# Patient Record
Sex: Female | Born: 1950
Health system: Southern US, Community
[De-identification: ages and names within clinical notes are randomized; demographics above are authoritative.]

## PROBLEM LIST (undated history)

## (undated) DIAGNOSIS — C801 Malignant (primary) neoplasm, unspecified: Secondary | ICD-10-CM

## (undated) DIAGNOSIS — C4490 Unspecified malignant neoplasm of skin, unspecified: Secondary | ICD-10-CM

## (undated) DIAGNOSIS — R519 Headache, unspecified: Secondary | ICD-10-CM

## (undated) DIAGNOSIS — E7800 Pure hypercholesterolemia, unspecified: Secondary | ICD-10-CM

## (undated) DIAGNOSIS — H539 Unspecified visual disturbance: Secondary | ICD-10-CM

## (undated) DIAGNOSIS — R51 Headache: Secondary | ICD-10-CM

## (undated) HISTORY — PX: HYSTERECTOMY ABDOMINAL WITH SALPINGECTOMY: SHX6725

## (undated) HISTORY — DX: Malignant (primary) neoplasm, unspecified: C80.1

## (undated) HISTORY — DX: Headache: R51

## (undated) HISTORY — DX: Unspecified malignant neoplasm of skin, unspecified: C44.90

## (undated) HISTORY — DX: Headache, unspecified: R51.9

## (undated) HISTORY — PX: TONSILLECTOMY: SUR1361

## (undated) HISTORY — DX: Pure hypercholesterolemia, unspecified: E78.00

## (undated) HISTORY — DX: Unspecified visual disturbance: H53.9

---

## 1999-03-26 ENCOUNTER — Other Ambulatory Visit: Admission: RE | Admit: 1999-03-26 | Discharge: 1999-03-26 | Payer: Self-pay | Admitting: *Deleted

## 1999-05-08 ENCOUNTER — Other Ambulatory Visit: Admission: RE | Admit: 1999-05-08 | Discharge: 1999-05-08 | Payer: Self-pay | Admitting: *Deleted

## 2000-04-05 ENCOUNTER — Other Ambulatory Visit: Admission: RE | Admit: 2000-04-05 | Discharge: 2000-04-05 | Payer: Self-pay | Admitting: *Deleted

## 2001-03-23 ENCOUNTER — Other Ambulatory Visit: Admission: RE | Admit: 2001-03-23 | Discharge: 2001-03-23 | Payer: Self-pay | Admitting: *Deleted

## 2001-04-11 ENCOUNTER — Inpatient Hospital Stay (HOSPITAL_COMMUNITY): Admission: RE | Admit: 2001-04-11 | Discharge: 2001-04-12 | Payer: Self-pay | Admitting: *Deleted

## 2001-06-04 ENCOUNTER — Emergency Department (HOSPITAL_COMMUNITY): Admission: EM | Admit: 2001-06-04 | Discharge: 2001-06-04 | Payer: Self-pay | Admitting: Emergency Medicine

## 2001-06-04 ENCOUNTER — Emergency Department (HOSPITAL_COMMUNITY): Admission: EM | Admit: 2001-06-04 | Discharge: 2001-06-05 | Payer: Self-pay | Admitting: Emergency Medicine

## 2002-06-21 ENCOUNTER — Other Ambulatory Visit: Admission: RE | Admit: 2002-06-21 | Discharge: 2002-06-21 | Payer: Self-pay | Admitting: *Deleted

## 2003-07-22 ENCOUNTER — Other Ambulatory Visit: Admission: RE | Admit: 2003-07-22 | Discharge: 2003-07-22 | Payer: Self-pay | Admitting: *Deleted

## 2003-10-15 ENCOUNTER — Inpatient Hospital Stay (HOSPITAL_COMMUNITY): Admission: AD | Admit: 2003-10-15 | Discharge: 2003-10-16 | Payer: Self-pay | Admitting: *Deleted

## 2003-10-16 ENCOUNTER — Encounter: Payer: Self-pay | Admitting: *Deleted

## 2014-03-29 ENCOUNTER — Other Ambulatory Visit: Payer: Self-pay | Admitting: Orthopedic Surgery

## 2014-03-29 DIAGNOSIS — M545 Low back pain, unspecified: Secondary | ICD-10-CM

## 2014-04-01 ENCOUNTER — Ambulatory Visit
Admission: RE | Admit: 2014-04-01 | Discharge: 2014-04-01 | Disposition: A | Discharge status: Home / Self Care | Payer: BC Managed Care – PPO | Source: Ambulatory Visit | Attending: Orthopedic Surgery | Admitting: Orthopedic Surgery

## 2014-04-01 DIAGNOSIS — M545 Low back pain, unspecified: Secondary | ICD-10-CM

## 2015-01-13 ENCOUNTER — Emergency Department: Payer: Self-pay | Admitting: Emergency Medicine

## 2015-01-13 LAB — URINALYSIS, COMPLETE
Bilirubin,UR: NEGATIVE
Glucose,UR: NEGATIVE mg/dL
Ketone: NEGATIVE
Nitrite: NEGATIVE
Ph: 5
Protein: 30
RBC,UR: 469 /HPF
Specific Gravity: 1.011
Squamous Epithelial: 5
WBC UR: 626 /HPF

## 2015-01-13 LAB — COMPREHENSIVE METABOLIC PANEL
Albumin: 3.7 g/dL (ref 3.4–5.0)
Alkaline Phosphatase: 67 U/L
Anion Gap: 8 (ref 7–16)
BUN: 10 mg/dL (ref 7–18)
Bilirubin,Total: 0.3 mg/dL (ref 0.2–1.0)
Calcium, Total: 8.3 mg/dL — ABNORMAL LOW (ref 8.5–10.1)
Chloride: 107 mmol/L (ref 98–107)
Co2: 24 mmol/L (ref 21–32)
Creatinine: 1.19 mg/dL (ref 0.60–1.30)
EGFR (African American): 59 — ABNORMAL LOW
EGFR (Non-African Amer.): 49 — ABNORMAL LOW
Glucose: 96 mg/dL (ref 65–99)
Osmolality: 276 (ref 275–301)
Potassium: 3.9 mmol/L (ref 3.5–5.1)
SGOT(AST): 18 U/L (ref 15–37)
SGPT (ALT): 47 U/L
Sodium: 139 mmol/L (ref 136–145)
Total Protein: 6.9 g/dL (ref 6.4–8.2)

## 2015-01-13 LAB — CBC
HCT: 47.1 % — ABNORMAL HIGH
HGB: 15.7 g/dL
MCH: 30.4 pg
MCHC: 33.2 g/dL
MCV: 92 fL
Platelet: 301 x10 3/mm 3
RBC: 5.15 X10 6/mm 3
RDW: 13 %
WBC: 9 x10 3/mm 3

## 2015-01-15 LAB — URINE CULTURE

## 2015-01-21 ENCOUNTER — Other Ambulatory Visit: Payer: Self-pay | Admitting: Family Medicine

## 2015-01-21 DIAGNOSIS — Z8271 Family history of polycystic kidney: Secondary | ICD-10-CM

## 2015-01-21 DIAGNOSIS — N390 Urinary tract infection, site not specified: Secondary | ICD-10-CM

## 2015-01-27 ENCOUNTER — Ambulatory Visit
Admission: RE | Admit: 2015-01-27 | Discharge: 2015-01-27 | Disposition: A | Discharge status: Home / Self Care | Payer: BC Managed Care – PPO | Source: Ambulatory Visit | Attending: Family Medicine | Admitting: Family Medicine

## 2015-01-27 DIAGNOSIS — N390 Urinary tract infection, site not specified: Secondary | ICD-10-CM

## 2015-01-27 DIAGNOSIS — Z8271 Family history of polycystic kidney: Secondary | ICD-10-CM

## 2016-06-30 ENCOUNTER — Other Ambulatory Visit: Payer: Self-pay | Admitting: *Deleted

## 2016-07-05 ENCOUNTER — Ambulatory Visit (INDEPENDENT_AMBULATORY_CARE_PROVIDER_SITE_OTHER): Payer: BC Managed Care – PPO | Admitting: Neurology

## 2016-07-05 ENCOUNTER — Encounter: Payer: Self-pay | Admitting: Neurology

## 2016-07-05 VITALS — BP 108/66 | HR 68 | Resp 14 | Ht 61.0 in | Wt 139.0 lb

## 2016-07-05 DIAGNOSIS — F419 Anxiety disorder, unspecified: Secondary | ICD-10-CM | POA: Diagnosis not present

## 2016-07-05 DIAGNOSIS — M542 Cervicalgia: Secondary | ICD-10-CM

## 2016-07-05 DIAGNOSIS — G43909 Migraine, unspecified, not intractable, without status migrainosus: Secondary | ICD-10-CM | POA: Diagnosis not present

## 2016-07-05 DIAGNOSIS — G4700 Insomnia, unspecified: Secondary | ICD-10-CM

## 2016-07-05 MED ORDER — TOPIRAMATE ER 100 MG PO CAP24: 100.0000 mg | ORAL_CAPSULE | Freq: Every day | ORAL | Status: DC

## 2016-07-05 MED ORDER — RELPAX 40 MG PO TABS: 40.0000 mg | ORAL_TABLET | Freq: Once | ORAL | Status: DC

## 2016-07-05 MED ORDER — ZOLPIDEM TARTRATE 10 MG PO TABS: 10.0000 mg | ORAL_TABLET | Freq: Every evening | ORAL | Status: DC | PRN

## 2016-07-05 NOTE — Progress Notes (Signed)
[facility]  PATIENT: Lisa Banks DOB: 1951/06/19  REFERRING DOCTOR OR PCP:  Mosetta Anis, MD [facility]) SOURCE: patient, records from Dr. Baltazar Najjar  _________________________________   HISTORICAL  CHIEF COMPLAINT:  Chief Complaint  Patient presents with  . Headaches    Former pt. of Dr. Conrad Tekonsha from [facility]. Here for eval of migraines.  Last had Botox around 2014.  She had been seeing Dr. Nile Dear after Dr. Ermalene Postin left.  Dr. Baltazar Najjar had started her on Trokendi and she continued Relpax as a rescue med. Sts. h/a's were stable until May.  Before h/a's got worse, she had a tickbite, which she saw her pcp for.  She wonders if she needs to be checked for RMSF or Lyme dz./fim  . Insomnia    She c/o difficulty going to and staying asleep. She takes Ambien 10mg , 1/2 tab nightly.  If rx. for Ambien 5mg  is given, insurance will only cover #15 tabs monthly.  So, she needs rx.for 10mg .  Last sleep study was yrs. ago at the [facility]/fim    HISTORY OF PRESENT ILLNESS:  I had the pleasure of seeing your patient, Lisa Banks, at [facility] for neurologic consultation regarding her daily headaches and insomnia.  Headache history: She has had migraine headaches since she was in her early teen years about 50 years ago. Initially, her migraines were intermittent and mostly associated with her period.   Then, in her 73s the headaches would also be triggered by stress and began to become more frequent. Sometimes she would have a week or 2 of headache in a row and then be better for a while. By her late 43s, the headaches were near daily. Typically, she would have a dull pain in the back of her head that would intensify on a near daily basis to include the entire head with pounding pain.  She would have nausea and sometimes vomiting. Additionally she had photophobia and phonophobia. Moving would make the headache worse.  Resting in a quiet dark room would  help. She was placed on Relpax at some point and that would help the headaches but the headaches often came back the next day. Several years ago, when she was seen Dr. Ermalene Postin, she took Botox for a couple years. That helped her headaches. She stopped Botox around 2014. She then started to see Dr. Baltazar Najjar and she placed her on Trokendi with Relpax as a rescue medication. That worked rather well for her until recently.    In early May, she had a tic bite. A couple days later she began to have a headache and the headache has been near daily since then. The headache is a little bit different than her typical migraine headaches in that it is more dull in the head and over the eyes without nausea and with less photophobia and phonophobia. Relpax helps but the headache will come back the next day.  Neck pain: She has had neck pain, right much more than left, since early May. Headaches often intensify from the neck pain.  Insomnia: She has a long history of insomnia with difficulty with sleep onset and sleep maintenance. Many years ago she had a sleep study at the [facility] headache clinic and was told that not reach deep sleep. She did not have sleep apnea. She has been taking Ambien 5 mg (one half of a 10 mg pill) nightly with benefit.  Mood: She takes Cymbalta for anxiety with benefit. She is on a  fairly low dose of 20 mg and tolerates it well.  REVIEW OF SYSTEMS: Constitutional: No fevers, chills, sweats, or change in appetite Eyes: No visual changes, double vision, eye pain Ear, nose and throat: No hearing loss, ear pain, nasal congestion, sore throat Cardiovascular: No chest pain, palpitations Respiratory: No shortness of breath at rest or with exertion.   No wheezes GastrointestinaI: No nausea, vomiting, diarrhea, abdominal pain, fecal incontinence Genitourinary: No dysuria, urinary retention or frequency.  No nocturia. Musculoskeletal: No neck pain, back pain Integumentary: No rash, pruritus, skin  lesions Neurological: as above Psychiatric: No depression at this time.  No anxiety Endocrine: No palpitations, diaphoresis, change in appetite, change in weigh or increased thirst Hematologic/Lymphatic: No anemia, purpura, petechiae. Allergic/Immunologic: No itchy/runny eyes, nasal congestion, recent allergic reactions, rashes  ALLERGIES: No Known Allergies  HOME MEDICATIONS:  Current outpatient prescriptions:  .  Topiramate ER (TROKENDI XR) 100 MG CP24, Take 100 mg by mouth daily., Disp: 30 capsule, Rfl: 11 .  DULoxetine (CYMBALTA) 20 MG capsule, Take by mouth., Disp: , Rfl:  .  RELPAX 40 MG tablet, Take 1 tablet (40 mg total) by mouth once. May repeat in 2 hours if headache persists or recurs., Disp: 12 tablet, Rfl: 11 .  zolpidem (AMBIEN) 10 MG tablet, Take 1 tablet (10 mg total) by mouth at bedtime as needed for sleep., Disp: 15 tablet, Rfl: 5  PAST MEDICAL HISTORY: Past Medical History  Diagnosis Date  . Headache   . Cancer ([place])   . Skin cancer   . High cholesterol   . Vision abnormalities     PAST SURGICAL HISTORY: Past Surgical History  Procedure Laterality Date  . Tonsillectomy    . Hysterectomy abdominal with salpingectomy    . Bladder repair w/ cesarean section      FAMILY HISTORY: Family History  Problem Relation Age of Onset  . Hypertension Mother   . Dementia Mother   . Stroke Father     SOCIAL HISTORY:  Social History   Social History  . Marital Status: Married    Spouse Name: N/A  . Number of Children: N/A  . Years of Education: N/A   Occupational History  . Not on file.   Social History Main Topics  . Smoking status: Never Smoker   . Smokeless tobacco: Not on file  . Alcohol Use: No  . Drug Use: No  . Sexual Activity: Not on file   Other Topics Concern  . Not on file   Social History Narrative  . No narrative on file     PHYSICAL EXAM  Filed Vitals:   07/05/16 1021  BP: 108/66  Pulse: 68  Resp: 14  Height: 5\' 1"  (1.549  m)  Weight: 139 lb (63.05 kg)    Body mass index is 26.28 kg/(m^2).   General: The patient is well-developed and well-nourished and in no acute distress  Eyes:  Funduscopic exam shows normal optic discs and retinal vessels.  Neck: The neck is supple, no carotid bruits are noted.  The neck is tender over the right splenius capitis and right trapezius and to a lesser extent the right lower cervical paraspinal muscles. Range of motion is slightly reduced when she looks to the right.  Cardiovascular: The heart has a regular rate and rhythm with a normal S1 and S2. There were no murmurs, gallops or rubs.    Skin: Extremities are without significant edema.   Neurologic Exam  Mental status: The patient is alert and oriented x  3 at the time of the examination. The patient has apparent normal recent and remote memory, with an apparently normal attention span and concentration ability.   Speech is normal.  Cranial nerves: Extraocular movements are full. Pupils are equal, round, and reactive to light and accomodation.   Facial symmetry is present. There is good facial sensation to soft touch bilaterally.Facial strength is normal.  Trapezius and sternocleidomastoid strength is normal. No dysarthria is noted.  The tongue is midline, and the patient has symmetric elevation of the soft palate. No obvious hearing deficits are noted.  Motor:  Muscle bulk is normal.   Tone is normal. Strength is  5 / 5 in all 4 extremities.   Sensory: Sensory testing is intact to pinprick, soft touch and vibration sensation in all 4 extremities.  Coordination: Cerebellar testing reveals good finger-nose-finger and heel-to-shin bilaterally.  Gait and station: Station is normal.   Gait is normal. Tandem gait is normal. Romberg is negative.   Reflexes: Deep tendon reflexes are symmetric and normal bilaterally.      DIAGNOSTIC DATA (LABS, IMAGING, TESTING) - I reviewed patient records, labs, notes, testing and imaging  myself where available.  Lab Results  Component Value Date   WBC 9.0 01/13/2015   HGB 15.7 01/13/2015   HCT 47.1* 01/13/2015   MCV 92 01/13/2015   PLT 301 01/13/2015      Component Value Date/Time   NA 139 01/13/2015 0847   K 3.9 01/13/2015 0847   CL 107 01/13/2015 0847   CO2 24 01/13/2015 0847   GLUCOSE 96 01/13/2015 0847   BUN 10 01/13/2015 0847   CREATININE 1.19 01/13/2015 0847   CALCIUM 8.3* 01/13/2015 0847   PROT 6.9 01/13/2015 0847   ALBUMIN 3.7 01/13/2015 0847   AST 18 01/13/2015 0847   ALT 47 01/13/2015 0847   ALKPHOS 67 01/13/2015 0847   BILITOT 0.3 01/13/2015 0847   GFRNONAA 49* 01/13/2015 0847   GFRAA 59* 01/13/2015 0847       ASSESSMENT AND PLAN  Insomnia  Migraine without status migrainosus, not intractable, unspecified migraine type  Neck pain  Anxiety    In summary, Lisa Banks is a 65 year old woman with chronic daily headache with characteristics different than her typical chronic daily migraine that she was experiencing that improved first with Botox and later with Trokendi.    On exam, she is tender over the right occipital nerve and the splenius capitis muscle as well as the trapezius muscle.   It is possible that occipital neuralgia is perpetuating her pain.  She may also have an element of headache and is advised not to take more than 4 Relpax in any week.  To help with the pain, a right splenius capitis and trapezius trigger point injection was performed with 80 mg Depo-Medrol in [place]. This should also cover the occipital nerve. She tolerated the procedure well and pain was better and range of motion was better afterwards.  If pain returns quickly, consider increasing the Cymbalta to 60 mg that might help. Additionally, consider obtaining a Lyme titer.    Otherwise, she will return to see me in 4 months or call sooner if she has new or worsening neurologic symptoms.  Thank you for asking me to see Lisa Banks. Please let me know if I can be  of further assistance with her or other patients in the future.  Richard A. Felecia Shelling, MD, PhD [id number], [id number] AM Certified in Neurology, Clinical Neurophysiology, Sleep Medicine, Pain Medicine and Neuroimaging  [facility] [address] [place], [place] [address] [phone]

## 2016-07-26 ENCOUNTER — Ambulatory Visit: Payer: Self-pay | Admitting: Neurology

## 2016-08-19 ENCOUNTER — Telehealth: Payer: Self-pay | Admitting: *Deleted

## 2016-08-19 NOTE — Telephone Encounter (Signed)
LMOM that I received a fax from her ins. co. ([employer]), stating they will not cover her Relpax.  She can go to www.goodrx.com--this site shows that she can request a $4 copay card for Relpax by calling [employer] at [phone], or by going to the following website: www.relpax.com/co-pay-card.  She can call me back if she has any questions/fim

## 2016-09-08 ENCOUNTER — Telehealth: Payer: Self-pay | Admitting: *Deleted

## 2016-09-08 NOTE — Telephone Encounter (Signed)
Trokendi PA completed by phone ( phone# [phone]).  Approved.  Effective Tdates 09-08-16 thru 12-26-16.  PA# [id number].  Pt. has tried and failed Topamax and Depakote.  Botox helped but ins. would not approve.  Pt. is aware of pa approval, and approval has been faxed to [facility], fax# [phone]/fim

## 2016-09-20 NOTE — Telephone Encounter (Signed)
The pt called she is interested in d/c from taking trokendi. She said the HA's are better and she is requesting RN to talk with Dr Felecia Shelling

## 2016-09-20 NOTE — Telephone Encounter (Signed)
I have spoken with Inge this morning and given an appt. with RAS tomorrow am to discuss/fim

## 2016-09-21 ENCOUNTER — Encounter: Payer: Self-pay | Admitting: Neurology

## 2016-09-21 ENCOUNTER — Ambulatory Visit (INDEPENDENT_AMBULATORY_CARE_PROVIDER_SITE_OTHER): Payer: Medicare Other | Admitting: Neurology

## 2016-09-21 VITALS — BP 122/60 | HR 70 | Resp 14 | Ht 61.0 in | Wt 140.0 lb

## 2016-09-21 DIAGNOSIS — M542 Cervicalgia: Secondary | ICD-10-CM

## 2016-09-21 DIAGNOSIS — F419 Anxiety disorder, unspecified: Secondary | ICD-10-CM | POA: Diagnosis not present

## 2016-09-21 DIAGNOSIS — G4700 Insomnia, unspecified: Secondary | ICD-10-CM

## 2016-09-21 DIAGNOSIS — G43909 Migraine, unspecified, not intractable, without status migrainosus: Secondary | ICD-10-CM | POA: Diagnosis not present
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
## 2016-11-09 ENCOUNTER — Ambulatory Visit: Payer: BC Managed Care – PPO | Admitting: Neurology

## 2016-11-17 ENCOUNTER — Telehealth: Payer: Self-pay | Admitting: *Deleted

## 2016-11-17 NOTE — Telephone Encounter (Signed)
Relpax PA completed with ins. phone# [phone].  Approved thru 12-26-17.  PA# [id number].  Pt. has tried and failed Rizatriptan and Sumatriptan/fim

## 2016-12-13 ENCOUNTER — Emergency Department: Payer: Medicare Other

## 2016-12-13 ENCOUNTER — Encounter: Payer: Self-pay | Admitting: Emergency Medicine

## 2016-12-13 ENCOUNTER — Emergency Department
Admission: EM | Admit: 2016-12-13 | Discharge: 2016-12-13 | Disposition: A | Discharge status: Home / Self Care | Payer: Medicare Other | Attending: Emergency Medicine | Admitting: Emergency Medicine

## 2016-12-13 ENCOUNTER — Telehealth: Payer: Self-pay

## 2016-12-13 DIAGNOSIS — Z85828 Personal history of other malignant neoplasm of skin: Secondary | ICD-10-CM | POA: Insufficient documentation

## 2016-12-13 DIAGNOSIS — Z79899 Other long term (current) drug therapy: Secondary | ICD-10-CM | POA: Diagnosis not present

## 2016-12-13 DIAGNOSIS — R079 Chest pain, unspecified: Secondary | ICD-10-CM

## 2016-12-13 DIAGNOSIS — R110 Nausea: Secondary | ICD-10-CM

## 2016-12-13 DIAGNOSIS — R0789 Other chest pain: Secondary | ICD-10-CM | POA: Diagnosis present

## 2016-12-13 DIAGNOSIS — R112 Nausea with vomiting, unspecified: Secondary | ICD-10-CM | POA: Diagnosis not present

## 2016-12-13 LAB — CBC
HEMATOCRIT: 47.4 % — AB (ref 35.0–47.0)
Hemoglobin: 16.4 g/dL — ABNORMAL HIGH (ref 12.0–16.0)
MCH: 30.4 pg (ref 26.0–34.0)
MCHC: 34.5 g/dL (ref 32.0–36.0)
MCV: 88.1 fL (ref 80.0–100.0)
PLATELETS: 236 10*3/uL (ref 150–440)
RBC: 5.38 MIL/uL — ABNORMAL HIGH (ref 3.80–5.20)
RDW: 13.2 % (ref 11.5–14.5)
WBC: 11 10*3/uL (ref 3.6–11.0)

## 2016-12-13 LAB — BASIC METABOLIC PANEL
Anion gap: 6 (ref 5–15)
BUN: 24 mg/dL — AB (ref 6–20)
CHLORIDE: 103 mmol/L (ref 101–111)
CO2: 31 mmol/L (ref 22–32)
CREATININE: 0.95 mg/dL (ref 0.44–1.00)
Calcium: 8.8 mg/dL — ABNORMAL LOW (ref 8.9–10.3)
GFR calc Af Amer: >60 mL/min (ref >60)
GFR calc non Af Amer: >60 mL/min (ref >60)
GLUCOSE: 110 mg/dL — AB (ref 65–99)
Potassium: 3.6 mmol/L (ref 3.5–5.1)
SODIUM: 140 mmol/L (ref 135–145)

## 2016-12-13 LAB — TROPONIN I
Troponin I: <0.03 ng/mL (ref <0.03)
Troponin I: <0.03 ng/mL (ref <0.03)

## 2016-12-13 MED ORDER — ONDANSETRON HCL 4 MG/2ML IJ SOLN
4.0000 mg | Freq: Once | INTRAMUSCULAR | Status: AC
  Administered 2016-12-13: 4 mg via INTRAVENOUS
  Filled 2016-12-13: qty 2

## 2016-12-13 MED ORDER — SODIUM CHLORIDE 0.9 % IV BOLUS (SEPSIS)
1000.0000 mL | Freq: Once | INTRAVENOUS | Status: AC
  Administered 2016-12-13: 1000 mL via INTRAVENOUS

## 2016-12-13 MED ORDER — ONDANSETRON 4 MG PO TBDP: 4.0000 mg | ORAL_TABLET | Freq: Three times a day (TID) | ORAL | 0 refills | Status: DC | PRN

## 2016-12-13 NOTE — ED Notes (Signed)
Repeat trop sent to lab

## 2016-12-13 NOTE — ED Triage Notes (Signed)
Central chest aching since ysterday.  Went to [employer] this am and almost passed out.  Vomited this am

## 2016-12-13 NOTE — ED Provider Notes (Signed)
[facility] Emergency Department Provider Note  Time seen: 12:38 PM  I have reviewed the triage vital signs and the nursing notes.   HISTORY  Chief Complaint Chest Pain    HPI Lisa Banks is a 65 y.o. female with a past medical history of anxiety, migraines, presents the emergency department with chest discomfort nausea and hot flashes. According to the patient she has been getting hot flashes for the past 2 years and this is not abnormal for her. However she states since yesterday she has been experiencing intermittent dull central chest discomfort. She is also been feeling nauseated with occasional episodes of vomiting including several this morning. Patient went to her doctor for evaluation and felt lightheaded and nauseated so they sent her to the emergency department for evaluation. Patient denies any chest pain at this time. Denies any shortness of breath. Patient's only symptom currently is feeling somewhat nauseated.  Past Medical History:  Diagnosis Date  . Cancer ([place])   . Headache   . High cholesterol   . Skin cancer   . Vision abnormalities     Patient Active Problem List   Diagnosis Date Noted  . Insomnia 07/05/2016  . Migraines 07/05/2016  . Neck pain 07/05/2016  . Anxiety 07/05/2016    Past Surgical History:  Procedure Laterality Date  . BLADDER REPAIR W/ CESAREAN SECTION    . HYSTERECTOMY ABDOMINAL WITH SALPINGECTOMY    . TONSILLECTOMY      Prior to Admission medications   Medication Sig Start Date End Date Taking? Authorizing Provider  DULoxetine (CYMBALTA) 20 MG capsule Take by mouth.    Historical Provider, MD  RELPAX 40 MG tablet Take 1 tablet (40 mg total) by mouth once. May repeat in 2 hours if headache persists or recurs. 07/05/16   Britt Bottom, MD  Topiramate ER (TROKENDI XR) 100 MG CP24 Take 100 mg by mouth daily. 07/05/16 10/03/16  Britt Bottom, MD  zolpidem (AMBIEN) 10 MG tablet Take 1 tablet (10 mg total) by mouth  at bedtime as needed for sleep. 07/05/16   Britt Bottom, MD    No Known Allergies  Family History  Problem Relation Age of Onset  . Hypertension Mother   . Dementia Mother   . Stroke Father     Social History Social History  Substance Use Topics  . Smoking status: Never Smoker  . Smokeless tobacco: Never Used  . Alcohol use No    Review of Systems Constitutional: Negative for fever. Cardiovascular: Intermittent chest pain Respiratory: Negative for shortness of breath. Gastrointestinal: Negative for abdominal pain. Positive for nausea and vomiting. Negative for diarrhea. Genitourinary: Negative for dysuria. Neurological: Negative for headache. States tingling in her lips hands and feet. 10-point ROS otherwise negative.  ____________________________________________   PHYSICAL EXAM:  VITAL SIGNS: ED Triage Vitals  Enc Vitals Group     BP 12/13/16 0958 (!) 100/56     Pulse Rate 12/13/16 0958 84     Resp 12/13/16 0958 16     Temp 12/13/16 0958 97.9 F (36.6 C)     Temp Source 12/13/16 0958 Oral     SpO2 12/13/16 0958 99 %     Weight 12/13/16 0959 140 lb (63.5 kg)     Height 12/13/16 0959 5\' 1"  (1.549 m)     Head Circumference --      Peak Flow --      Pain Score 12/13/16 0959 3     Pain Loc --  Pain Edu? --      Excl. in [place]? --     Constitutional: Alert and oriented. Well appearing and in no distress. Eyes: Normal exam ENT   Head: Normocephalic and atraumatic   Mouth/Throat: Mucous membranes are moist. Cardiovascular: Normal rate, regular rhythm. No murmur Respiratory: Normal respiratory effort without tachypnea nor retractions. Breath sounds are clear  Gastrointestinal: Soft and nontender. No distention.   Musculoskeletal: Nontender with normal range of motion in all extremities.  Neurologic:  Normal speech and language. No gross focal neurologic deficits Skin:  Skin is warm, dry and intact.  Psychiatric: Mood and affect are normal.   ____________________________________________    EKG  EKG reviewed and interpreted by myself shows normal sinus rhythm at 85 bpm, narrow QRS, normal axis, normal intervals, no ST changes. Normal EKG.  ____________________________________________    RADIOLOGY  Chest x-ray is negative  ____________________________________________   INITIAL IMPRESSION / ASSESSMENT AND PLAN / ED COURSE  Pertinent labs & imaging results that were available during my care of the patient were reviewed by me and considered in my medical decision making (see chart for details).  Patient presents the emergency department for intermittent chest discomfort since last night, along with hot flashes nausea and vomiting. Patient has a nontender abdominal exam, denies any abdominal pain. Denies any current chest pain or trouble breathing. Patient's EKG is a very reassuring. Chest x-ray is negative. Labs are largely within normal limits including negative troponin. Given the patient's continued nausea, with episodes of lightheadedness this morning we will IV hydrate, treat with IV Zofran and repeat a troponin. The patient's repeat troponin is improved and she is feeling well we will likely discharge home with PCP follow-up. I discussed this plan. The patient who is agreeable.  Patient's repeat troponin is negative.  Patient is feeling better. We will discharge with nausea medication and PCP follow-up. I discussed strict chest pain return precautions.  ____________________________________________   FINAL CLINICAL IMPRESSION(S) / ED DIAGNOSES  Chest pain Nausea    Harvest Dark, MD 12/13/16 1358

## 2016-12-13 NOTE — Discharge Instructions (Signed)
You have been seen in the emergency department today for chest pain. Your workup has shown normal results. As we discussed please follow-up with your primary care physician in the next 1-2 days for recheck. Return to the emergency department for any further chest pain, trouble breathing, or any other symptom personally concerning to yourself. Please call the number provided for cardiology to arrange a follow-up appointment for possible stress test.

## 2016-12-13 NOTE — Telephone Encounter (Signed)
Lmov for patient to call back and schedule ED fu from 12/13/16  Pt was seen for Chest pain

## 2016-12-13 NOTE — ED Notes (Signed)
Hooked pt up to cardiac monitor.

## 2016-12-21 NOTE — Telephone Encounter (Signed)
Lmov for patient to call back and schedule ED fu from 12/13/16  Pt was seen for Chest pain

## 2016-12-29 NOTE — Telephone Encounter (Signed)
Sent leter

## 2017-06-14 ENCOUNTER — Ambulatory Visit: Payer: Medicare Other

## 2017-09-06 ENCOUNTER — Other Ambulatory Visit: Payer: Self-pay | Admitting: Neurology

## 2017-10-16 ENCOUNTER — Other Ambulatory Visit: Payer: Self-pay | Admitting: Neurology

## 2017-10-26 ENCOUNTER — Ambulatory Visit: Payer: Medicare Other | Admitting: Neurology

## 2017-10-27 ENCOUNTER — Ambulatory Visit (INDEPENDENT_AMBULATORY_CARE_PROVIDER_SITE_OTHER): Payer: Medicare Other | Admitting: Neurology

## 2017-10-27 ENCOUNTER — Encounter: Payer: Self-pay | Admitting: Neurology

## 2017-10-27 VITALS — BP 106/68 | HR 74 | Resp 14 | Ht 61.0 in | Wt 150.5 lb

## 2017-10-27 DIAGNOSIS — G43909 Migraine, unspecified, not intractable, without status migrainosus: Secondary | ICD-10-CM

## 2017-10-27 DIAGNOSIS — F419 Anxiety disorder, unspecified: Secondary | ICD-10-CM | POA: Diagnosis not present

## 2017-10-27 DIAGNOSIS — G4700 Insomnia, unspecified: Secondary | ICD-10-CM

## 2017-10-27 DIAGNOSIS — M542 Cervicalgia: Secondary | ICD-10-CM

## 2017-10-27 MED ORDER — ELETRIPTAN HYDROBROMIDE 40 MG PO TABS: 40.0000 mg | ORAL_TABLET | ORAL | 11 refills | Status: DC | PRN

## 2017-10-27 MED ORDER — FREMANEZUMAB-VFRM 225 MG/1.5ML ~~LOC~~ SOSY: 3.0000 | PREFILLED_SYRINGE | SUBCUTANEOUS | 4 refills | Status: DC

## 2017-10-27 NOTE — Progress Notes (Signed)
[facility]  PATIENT: Lisa Banks DOB: 1951/08/02  REFERRING DOCTOR OR PCP:  Mosetta Anis, MD [facility]) SOURCE: patient, records from Dr. Baltazar Najjar  _________________________________   HISTORICAL  CHIEF COMPLAINT:  Chief Complaint  Patient presents with  . Migraines    Has stopped Trokendi and feels h/a's are about the same--about 8-10 h/a days per month.  Sts. Relpax is still effective, but she is concerned that she has to use all #12 tablets per month.  Would like to discuss Botox or Ajovy for h/a prevention.  Had Botox in the past (at least 5 yrs. ago) with good results. Tried and failed meds: Trokendi (ineffective), Depakote (intolerance), Cymbalta (ineffective),  Amitriptyline (ineffective), Maxalt, Imitrex and DHE--all effective at first, but then less so./fim    HISTORY OF PRESENT ILLNESS:  Lisa Banks is a 66 yo woman with headaches and insomnia.     HA:   She is having fequent headaches occurring 2-3 times a week (8-12 days/month for greater than 4 hours).      They are mostly random though some are triggered when she gets angry.   Pain often starts behind hr er eyes but current pain is in the left occiput.    She gets nausea but not vomiting.    She gets photophobia and phonophobia. Relpax usually helps but she takes 2 some days and uses all 12/month she is prescribed.     In the past, she sometimes got longer term benefit with an occipital nerve block.  Trokendi helped the frequency but cognition was fuzzy.   Botox had helped in the past  Headache history: She has had migraine headaches since she was in her early teen years about 50 years ago. Initially, her migraines were intermittent and mostly associated with her period.   Then, in her 29s the headaches would also be triggered by stress and began to become more frequent. Sometimes she would have a week or 2 of headache in a row and then be better for a while. By her late 29s, the headaches were  near daily. Typically, she would have a dull pain in the back of her head that would intensify on a near daily basis to include the entire head with pounding pain.  She would have nausea and sometimes vomiting. Additionally she had photophobia and phonophobia. Moving would make the headache worse.  Resting in a quiet dark room would help. She was placed on Relpax at some point and that would help the headaches but the headaches often came back the next day. Several years ago, when she was seen Dr. Ermalene Postin, she took Botox for a couple years. That helped her headaches. She stopped Botox around 2014. She then started to see Dr. Baltazar Najjar and she placed her on Trokendi with Relpax as a rescue medication. That worked rather well for her until recently.    In early May, she had a tic bite. A couple days later she began to have a headache and the headache has been near daily since then. The headache is a little bit different than her typical migraine headaches in that it is more dull in the head and over the eyes without nausea and with less photophobia and phonophobia. Relpax helps but the headache will come back the next day.  Neck pain: She has pain in the occiput and upper paraspinal muscles, right greater than left. In the past, trigger point injections have helped. Often the neck pain procedure headache.  Insomnia/sleep issues: She has a long history of insomnia with difficulty with sleep onset and sleep maintenance. She is back on Ambien 5 mg (Belsomra caused weird dreams).      Many years ago she had a sleep study at the [facility] headache clinic and was told that not reach deep sleep.   She snores and is sleepy.   She talks in her sleep and rarely acts out a dream (for many years > 10).    Mood: She feels mood is better on Cymbalta.   She is better on 60 mg than a lower dose.    REVIEW OF SYSTEMS: Constitutional: No fevers, chills, sweats, or change in appetite.  Reports insomnia Eyes: No visual changes,  double vision, eye pain Ear, nose and throat: No hearing loss, ear pain, nasal congestion, sore throat Cardiovascular: No chest pain, palpitations Respiratory: No shortness of breath at rest or with exertion.   No wheezes GastrointestinaI: No nausea, vomiting, diarrhea, abdominal pain, fecal incontinence Genitourinary: No dysuria, urinary retention or frequency.  No nocturia. Musculoskeletal: No neck pain, back pain Integumentary: No rash, pruritus, skin lesions Neurological: as above Psychiatric: Has anxiety but No depression Endocrine: No palpitations, diaphoresis, change in appetite, change in weigh or increased thirst Hematologic/Lymphatic: No anemia, purpura, petechiae. Allergic/Immunologic: No itchy/runny eyes, nasal congestion, recent allergic reactions, rashes  ALLERGIES: No Known Allergies  HOME MEDICATIONS:  Current Outpatient Prescriptions:  .  Calcium Carbonate-Vitamin D (CALCIUM HIGH POTENCY/VITAMIN D) 600-200 MG-UNIT TABS, Take by mouth., Disp: , Rfl:  .  DULoxetine (CYMBALTA) 60 MG capsule, TK 1 C PO ONCE A DAY, Disp: , Rfl: 1 .  eletriptan (RELPAX) 40 MG tablet, Take 1 tablet (40 mg total) by mouth every 2 (two) hours as needed for migraine or headache. May repeat in 2 hours if headache persists or recurs., Disp: 12 tablet, Rfl: 11 .  estradiol (VIVELLE-DOT) 0.025 MG/24HR, APP 1 PA EXT TO THE SKIN 2 TIMES A WK, Disp: , Rfl: 6 .  ondansetron (ZOFRAN ODT) 4 MG disintegrating tablet, Take 1 tablet (4 mg total) by mouth every 8 (eight) hours as needed for nausea or vomiting., Disp: 20 tablet, Rfl: 0 .  zolpidem (AMBIEN) 10 MG tablet, Take 1 tablet (10 mg total) by mouth at bedtime as needed for sleep. (Patient taking differently: Take 5 mg by mouth at bedtime as needed for sleep. ), Disp: 15 tablet, Rfl: 5 .  Fremanezumab-vfrm (AJOVY) 225 MG/1.5ML SOSY, Inject 3 Syringes into the skin every 3 (three) months., Disp: 3 Syringe, Rfl: 4  PAST MEDICAL HISTORY: Past Medical  History:  Diagnosis Date  . Cancer ([place])   . Headache   . High cholesterol   . Skin cancer   . Vision abnormalities     PAST SURGICAL HISTORY: Past Surgical History:  Procedure Laterality Date  . BLADDER REPAIR W/ CESAREAN SECTION    . HYSTERECTOMY ABDOMINAL WITH SALPINGECTOMY    . TONSILLECTOMY      FAMILY HISTORY: Family History  Problem Relation Age of Onset  . Hypertension Mother   . Dementia Mother   . Stroke Father     SOCIAL HISTORY:  Social History   Social History  . Marital status: Married    Spouse name: N/A  . Number of children: N/A  . Years of education: N/A   Occupational History  . Not on file.   Social History Main Topics  . Smoking status: Never Smoker  . Smokeless tobacco: Never Used  . Alcohol use No  .  Drug use: No  . Sexual activity: Not on file   Other Topics Concern  . Not on file   Social History Narrative  . No narrative on file     PHYSICAL EXAM  Vitals:   10/27/17 1046  BP: 106/68  Pulse: 74  Resp: 14  Weight: 150 lb 8 oz (68.3 kg)  Height: 5\' 1"  (1.549 m)    Body mass index is 28.44 kg/m.   General: The patient is well-developed and well-nourished and in no acute distress  Neck: The neck is supple, no carotid bruits are noted.  Neck is tender over the occiput..   Neurologic Exam  Mental status: The patient is alert and oriented x 3 at the time of the examination. The patient has apparent normal recent and remote memory, with an apparently normal attention span and concentration ability.   Speech is normal.  Cranial nerves: Extraocular movements are full. Facial strength and sensation is normal. Trapezius strength is normal.  The tongue is midline, and the patient has symmetric elevation of the soft palate. No obvious hearing deficits are noted.  Motor:  Muscle bulk is normal.   Tone is normal. Strength is  5 / 5 in all 4 extremities.   Sensory: Sensory testing is intact to touch and vibration sensation in  all 4 extremities.  Coordination: Cerebellar testing reveals good finger-nose-finger and heel-to-shin bilaterally.  Gait and station: Station is normal.   Gait and tandem gait are normal.. Romberg is negative.   Reflexes: Deep tendon reflexes are symmetric and normal bilaterally.      DIAGNOSTIC DATA (LABS, IMAGING, TESTING) - I reviewed patient records, labs, notes, testing and imaging myself where available.  Lab Results  Component Value Date   WBC 11.0 12/13/2016   HGB 16.4 (H) 12/13/2016   HCT 47.4 (H) 12/13/2016   MCV 88.1 12/13/2016   PLT 236 12/13/2016      Component Value Date/Time   NA 140 12/13/2016 1003   NA 139 01/13/2015 0847   K 3.6 12/13/2016 1003   K 3.9 01/13/2015 0847   CL 103 12/13/2016 1003   CL 107 01/13/2015 0847   CO2 31 12/13/2016 1003   CO2 24 01/13/2015 0847   GLUCOSE 110 (H) 12/13/2016 1003   GLUCOSE 96 01/13/2015 0847   BUN 24 (H) 12/13/2016 1003   BUN 10 01/13/2015 0847   CREATININE 0.95 12/13/2016 1003   CREATININE 1.19 01/13/2015 0847   CALCIUM 8.8 (L) 12/13/2016 1003   CALCIUM 8.3 (L) 01/13/2015 0847   PROT 6.9 01/13/2015 0847   ALBUMIN 3.7 01/13/2015 0847   AST 18 01/13/2015 0847   ALT 47 01/13/2015 0847   ALKPHOS 67 01/13/2015 0847   BILITOT 0.3 01/13/2015 0847   GFRNONAA >60 12/13/2016 1003   GFRNONAA 49 (L) 01/13/2015 0847   GFRAA >60 12/13/2016 1003   GFRAA 59 (L) 01/13/2015 0847       ASSESSMENT AND PLAN  Migraine without status migrainosus, not intractable, unspecified migraine type  Neck pain  Insomnia, unspecified type  Anxiety   1.    Ajovy 1 syringe monthly or the syringes every 3 months for frequent episodic migraines. Covered by insurance, consider restarting Trokendi and also consider enrollment in a drug study for episodic migraine. 2.   TPI left splenius capitus with 80 mg Depo-Medrol and Marcaine. She tolerated the procedure well. 3    Continue Relpax for intermittent classic migraine.     Richard  A. Felecia Shelling, MD, PhD 10/27/2017, 1:13 PM  Certified in Neurology, [place], Sleep Medicine, Pain Medicine and Neuroimaging  [facility] [address], [place], [place] [address] [phone]

## 2017-11-04 ENCOUNTER — Telehealth: Payer: Self-pay | Admitting: *Deleted

## 2017-11-04 NOTE — Telephone Encounter (Signed)
PA for Ajovy 225mg /1.27ml, 3 syringes SQ Q74mos. completed via phone with [employer] (phone# [phone]).  Dx: Chronic Migraine (G43.909).  Tried and failed meds: Botox, Trokendi, Depakote, Cymbalta, Amitriptyline, Maxalt, DHE, Imitrex/fim

## 2017-11-15 NOTE — Telephone Encounter (Signed)
PA for Ajovy was denied, but then approved on appeal by [employer] (hone# [phone]). Approval dates are 11/04/17 thru 12/26/18.  PA# [id number]. Pt. aware and approval letter has been faxed to [facility]/fim

## 2017-11-18 ENCOUNTER — Other Ambulatory Visit: Payer: Self-pay | Admitting: Neurology

## 2017-12-07 ENCOUNTER — Telehealth: Payer: Self-pay

## 2017-12-07 NOTE — Telephone Encounter (Signed)
SENT NOTES TO SCHEDULING 

## 2017-12-30 ENCOUNTER — Ambulatory Visit: Payer: Medicare Other | Admitting: Internal Medicine

## 2017-12-30 ENCOUNTER — Encounter: Payer: Self-pay | Admitting: Internal Medicine

## 2017-12-30 VITALS — BP 106/70 | HR 76 | Ht 61.0 in | Wt 152.6 lb

## 2017-12-30 DIAGNOSIS — R42 Dizziness and giddiness: Secondary | ICD-10-CM

## 2017-12-30 DIAGNOSIS — R079 Chest pain, unspecified: Secondary | ICD-10-CM

## 2017-12-30 DIAGNOSIS — R0602 Shortness of breath: Secondary | ICD-10-CM | POA: Diagnosis not present

## 2017-12-30 DIAGNOSIS — R002 Palpitations: Secondary | ICD-10-CM | POA: Diagnosis not present

## 2017-12-30 DIAGNOSIS — E782 Mixed hyperlipidemia: Secondary | ICD-10-CM

## 2017-12-30 NOTE — Patient Instructions (Signed)
Medication Instructions:  Your physician recommends that you continue on your current medications as directed. Please refer to the Current Medication list given to you today.   Labwork: None   Testing/Procedures: Your physician has requested that you have an echocardiogram. Echocardiography is a painless test that uses sound waves to create images of your heart. It provides your doctor with information about the size and shape of your heart and how well your heart's chambers and valves are working. This procedure takes approximately one hour. There are no restrictions for this procedure. [place]  Your physician has recommended that you wear a holter monitor. Holter monitors are medical devices that record the heart's electrical activity. Doctors most often use these monitors to diagnose arrhythmias. Arrhythmias are problems with the speed or rhythm of the heartbeat. The monitor is a small, portable device. You can wear one while you do your normal daily activities. This is usually used to diagnose what is causing palpitations/syncope (passing out).  [place]: Your physician recommends that you schedule a follow-up appointment in: 6 weeks with Dr End in the [facility].   Any Other Special Instructions Will Be Listed Below (If Applicable).     If you need a refill on your cardiac medications before your next appointment, please call your pharmacy.
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
## 2017-12-31 ENCOUNTER — Encounter: Payer: Self-pay | Admitting: Internal Medicine

## 2017-12-31 DIAGNOSIS — R42 Dizziness and giddiness: Secondary | ICD-10-CM | POA: Insufficient documentation

## 2017-12-31 DIAGNOSIS — R0789 Other chest pain: Secondary | ICD-10-CM | POA: Insufficient documentation

## 2017-12-31 DIAGNOSIS — E782 Mixed hyperlipidemia: Secondary | ICD-10-CM | POA: Insufficient documentation

## 2017-12-31 DIAGNOSIS — R0602 Shortness of breath: Secondary | ICD-10-CM | POA: Insufficient documentation

## 2017-12-31 DIAGNOSIS — R002 Palpitations: Secondary | ICD-10-CM | POA: Insufficient documentation

## 2018-01-04 ENCOUNTER — Ambulatory Visit (INDEPENDENT_AMBULATORY_CARE_PROVIDER_SITE_OTHER): Payer: Medicare Other

## 2018-01-04 DIAGNOSIS — R002 Palpitations: Secondary | ICD-10-CM

## 2018-01-04 DIAGNOSIS — R42 Dizziness and giddiness: Secondary | ICD-10-CM

## 2018-01-09 ENCOUNTER — Ambulatory Visit
Admission: RE | Admit: 2018-01-09 | Discharge: 2018-01-09 | Disposition: A | Discharge status: Home / Self Care | Payer: Medicare Other | Source: Ambulatory Visit | Attending: Internal Medicine | Admitting: Internal Medicine

## 2018-01-09 DIAGNOSIS — R42 Dizziness and giddiness: Secondary | ICD-10-CM | POA: Insufficient documentation

## 2018-01-09 DIAGNOSIS — I491 Atrial premature depolarization: Secondary | ICD-10-CM | POA: Diagnosis not present

## 2018-01-09 DIAGNOSIS — R002 Palpitations: Secondary | ICD-10-CM | POA: Diagnosis present

## 2018-01-10 ENCOUNTER — Ambulatory Visit (INDEPENDENT_AMBULATORY_CARE_PROVIDER_SITE_OTHER): Payer: Medicare Other

## 2018-01-10 ENCOUNTER — Other Ambulatory Visit: Payer: Self-pay

## 2018-01-10 ENCOUNTER — Other Ambulatory Visit: Payer: Self-pay | Admitting: Internal Medicine

## 2018-01-10 DIAGNOSIS — R079 Chest pain, unspecified: Secondary | ICD-10-CM | POA: Diagnosis not present

## 2018-01-10 DIAGNOSIS — R002 Palpitations: Secondary | ICD-10-CM | POA: Diagnosis not present

## 2018-01-10 DIAGNOSIS — R0602 Shortness of breath: Secondary | ICD-10-CM | POA: Diagnosis not present

## 2018-01-11 ENCOUNTER — Telehealth: Payer: Self-pay | Admitting: *Deleted

## 2018-01-11 DIAGNOSIS — R0602 Shortness of breath: Secondary | ICD-10-CM

## 2018-01-11 DIAGNOSIS — R079 Chest pain, unspecified: Secondary | ICD-10-CM

## 2018-01-11 NOTE — Telephone Encounter (Signed)
No answer. Left message to call back.   

## 2018-01-11 NOTE — Telephone Encounter (Signed)
-----   Message from Nelva Bush, MD sent at 01/11/2018 12:54 PM EST ----- Please let Lisa Banks know that her echo is normal and her Holter monitor showed only rare extra beats (which did not correspond to the symptoms that she reported). I recommend that we obtain a pharmacologic myocardial perfusion stress test for further evaluation. We will follow-up in the office as previously discussed after the stress test.

## 2018-01-11 NOTE — Telephone Encounter (Signed)
Patient returning call for results.  Please call.   °

## 2018-01-11 NOTE — Telephone Encounter (Signed)
Returned call to patient. She verbalized understanding of results and agreed to [facility]. Patient scheduled for 01/18/18 at 0730 with arrival time of 7:15 am. Patient verbalized understanding of the following instructions.  [place]  Your caregiver has ordered a Stress Test with nuclear imaging. The purpose of this test is to evaluate the blood supply to your heart muscle. This procedure is referred to as a "Non-Invasive Stress Test." This is because other than having an IV started in your vein, nothing is inserted or "invades" your body. Cardiac stress tests are done to find areas of poor blood flow to the heart by determining the extent of coronary artery disease (CAD). Some patients exercise on a treadmill, which naturally increases the blood flow to your heart, while others who are  unable to walk on a treadmill due to physical limitations have a pharmacologic/chemical stress agent called Lexiscan . This medicine will mimic walking on a treadmill by temporarily increasing your coronary blood flow.   Please note: these test may take anywhere between 2-4 hours to complete  PLEASE REPORT TO [facility] AT THE FIRST DESK WILL DIRECT YOU WHERE TO GO  Date of Procedure:____01/23/19__________  Arrival Time for Procedure:_______07:15 am___________    PLEASE NOTIFY THE OFFICE AT LEAST 24 HOURS IN ADVANCE IF YOU ARE UNABLE TO KEEP YOUR APPOINTMENT.  [phone] AND  PLEASE NOTIFY NUCLEAR MEDICINE AT [facility] [address] AT LEAST 24 HOURS IN ADVANCE IF YOU ARE UNABLE TO KEEP YOUR APPOINTMENT. [phone]  How to prepare for your Myoview test:  1. Do not eat or drink after midnight 2. No caffeine for 24 hours prior to test 3. No smoking 24 hours prior to test. 4. Your medication may be taken with water.  If your doctor stopped a medication because of this test, do not take that medication. 5. Ladies, please do not wear dresses.  Skirts or pants are appropriate. Please  wear a short sleeve shirt. 6. No perfume, cologne or lotion. 7. Wear comfortable walking shoes. No heels!

## 2018-01-13 ENCOUNTER — Other Ambulatory Visit: Payer: Self-pay | Admitting: Gastroenterology

## 2018-01-13 ENCOUNTER — Telehealth: Payer: Self-pay | Admitting: Internal Medicine

## 2018-01-13 DIAGNOSIS — R1310 Dysphagia, unspecified: Secondary | ICD-10-CM

## 2018-01-13 NOTE — Telephone Encounter (Signed)
Pt would like to cancel her stress test for 1/23, and r/s to 1/25. Please call.

## 2018-01-16 NOTE — Telephone Encounter (Signed)
Stress test has been rescheduled to 01/20/18 at 7:30 am.  I left a message for the patient to call and confirm.

## 2018-01-16 NOTE — Telephone Encounter (Signed)
I spoke with the patient- she is agreeable with her stress test being on 01/20/18 at 7:30 am.

## 2018-01-17 ENCOUNTER — Other Ambulatory Visit: Payer: Medicare Other

## 2018-01-17 ENCOUNTER — Encounter: Payer: Self-pay | Admitting: Internal Medicine

## 2018-01-18 ENCOUNTER — Other Ambulatory Visit: Payer: Medicare Other

## 2018-01-20 ENCOUNTER — Ambulatory Visit
Admission: RE | Admit: 2018-01-20 | Discharge: 2018-01-20 | Disposition: A | Discharge status: Home / Self Care | Payer: Medicare Other | Source: Ambulatory Visit | Attending: Internal Medicine | Admitting: Internal Medicine

## 2018-01-20 DIAGNOSIS — R079 Chest pain, unspecified: Secondary | ICD-10-CM | POA: Insufficient documentation

## 2018-01-20 DIAGNOSIS — R0602 Shortness of breath: Secondary | ICD-10-CM | POA: Diagnosis not present

## 2018-01-20 LAB — NM MYOCAR MULTI W/SPECT W/WALL MOTION / EF
CHL CUP MPHR: 154 {beats}/min
CHL CUP NUCLEAR SDS: 0
CHL CUP NUCLEAR SRS: 0
CHL CUP RESTING HR STRESS: 77 {beats}/min
CSEPED: 0 min
CSEPEW: 1 METS
CSEPHR: 68 %
Exercise duration (sec): 0 s
LV dias vol: 42 mL (ref 46–106)
LV sys vol: 7 mL
Peak HR: 105 {beats}/min
SSS: 2
TID: 0.96

## 2018-01-20 MED ORDER — REGADENOSON 0.4 MG/5ML IV SOLN
0.4000 mg | Freq: Once | INTRAVENOUS | Status: AC
  Administered 2018-01-20: 0.4 mg via INTRAVENOUS

## 2018-01-20 MED ORDER — TECHNETIUM TC 99M TETROFOSMIN IV KIT
33.4000 | PACK | Freq: Once | INTRAVENOUS | Status: AC | PRN
  Administered 2018-01-20: 33.4 via INTRAVENOUS

## 2018-01-20 MED ORDER — TECHNETIUM TC 99M TETROFOSMIN IV KIT
13.9000 | PACK | Freq: Once | INTRAVENOUS | Status: AC | PRN
  Administered 2018-01-20: 13.9 via INTRAVENOUS

## 2018-01-24 ENCOUNTER — Ambulatory Visit
Admission: RE | Admit: 2018-01-24 | Discharge: 2018-01-24 | Disposition: A | Discharge status: Home / Self Care | Payer: Medicare Other | Source: Ambulatory Visit | Attending: Gastroenterology | Admitting: Gastroenterology

## 2018-01-24 DIAGNOSIS — R1310 Dysphagia, unspecified: Secondary | ICD-10-CM

## 2018-02-22 ENCOUNTER — Encounter: Payer: Self-pay | Admitting: Internal Medicine

## 2018-02-22 ENCOUNTER — Ambulatory Visit: Payer: Medicare Other | Admitting: Internal Medicine

## 2018-02-22 VITALS — BP 112/60 | HR 85 | Ht 62.0 in | Wt 150.2 lb

## 2018-02-22 DIAGNOSIS — R002 Palpitations: Secondary | ICD-10-CM

## 2018-02-22 DIAGNOSIS — R404 Transient alteration of awareness: Secondary | ICD-10-CM | POA: Diagnosis not present

## 2018-02-22 DIAGNOSIS — R0789 Other chest pain: Secondary | ICD-10-CM | POA: Diagnosis not present

## 2018-02-22 DIAGNOSIS — R0602 Shortness of breath: Secondary | ICD-10-CM | POA: Diagnosis not present

## 2018-02-22 NOTE — Progress Notes (Signed)
Follow-up Outpatient Visit Date: 02/22/2018  Primary Care Provider: Carol Ada, [place] [address]  Chief Complaint: Follow-up chest pain, shortness of breath, palpitations, and low blood pressure  HPI:  Lisa Banks is a 67 y.o. year-old female with history of hyperlipidemia, migraine headaches, anxiety, depression, and vertigo, who presents for follow-up of chest pain, shortness of breath, palpitations, and low blood pressure.  I met her on 12/30/17, at which time she expressed numerous complaints.  We subsequently performed a transthoracic echocardiogram, myocardial perfusion stress test, and 48-hour Holter monitor.  Only notable finding was a 9 beat atrial run captured on the Holter monitor.  Today, Lisa Banks reports that she is feeling much better than at our prior visit.  She underwent barium swallow and was diagnosed with GERD.  She has been started on a PPI with significant improvement in chest tightness and dysphasia.  Since discontinuing fish oil, her low diastolic blood pressures have also resolved.  She has also cut back on Ambien and is now to try to take just 2.5-5 mg as needed if she has difficulty sleeping.  At one point, she was taking up to 20 mg every night.  She has not had any more "foggy" episodes or out of body sensations since decreasing Ambien.  --------------------------------------------------------------------------------------------------  Cardiovascular History & Procedures: Cardiovascular Problems:  Palpitations  Shortness of breath  Chest tightness  Risk Factors:  Hyperlipidemia and age greater than 41  Cath/PCI:  None  CV Surgery:  None  EP Procedures and Devices:  48-hour Holter monitor (01/04/18): Predominantly sinus rhythm with single PAC and single atrial run lasting 9 beats.  No sustained arrhythmia.  Non-Invasive Evaluation(s):  Pharmacologic MPI (01/20/18): Low risk study without ischemia or scar.   LVEF greater than 65%.  TTE (01/10/18): Normal LV size and wall thickness.  LVEF 60-65% with normal wall motion and diastolic function.  Normal RV size and function.  No significant valvular abnormalities.  Recent CV Pertinent Labs: Lab Results  Component Value Date   K 3.6 12/13/2016   K 3.9 01/13/2015   BUN 24 (H) 12/13/2016   BUN 10 01/13/2015   CREATININE 0.95 12/13/2016   CREATININE 1.19 01/13/2015    Past medical and surgical history were reviewed and updated in EPIC.  Current Meds  Medication Sig  . Calcium Carbonate-Vitamin D (CALCIUM HIGH POTENCY/VITAMIN D) 600-200 MG-UNIT TABS Take by mouth.  . DULoxetine (CYMBALTA) 60 MG capsule TK 1 C PO ONCE A DAY  . eletriptan (RELPAX) 40 MG tablet Take 1 tablet (40 mg total) by mouth every 2 (two) hours as needed for migraine or headache. May repeat in 2 hours if headache persists or recurs.  Marland Kitchen esomeprazole (NEXIUM) 20 MG capsule Take 20 mg by mouth daily at 12 noon.  Marland Kitchen estradiol (VIVELLE-DOT) 0.025 MG/24HR APP 1 PA EXT TO THE SKIN 2 TIMES A WK  . Fremanezumab-vfrm (AJOVY) 225 MG/1.5ML SOSY Inject 3 Syringes into the skin every 3 (three) months.  . ondansetron (ZOFRAN ODT) 4 MG disintegrating tablet Take 1 tablet (4 mg total) by mouth every 8 (eight) hours as needed for nausea or vomiting.  Marland Kitchen zolpidem (AMBIEN) 10 MG tablet Take 1 tablet (10 mg total) by mouth at bedtime as needed for sleep. (Patient taking differently: Take 5 mg by mouth at bedtime as needed for sleep. )    Allergies: Patient has no known allergies.  Social History   Socioeconomic History  . Marital status: Married  Spouse name: Not on file  . Number of children: Not on file  . Years of education: Not on file  . Highest education level: Not on file  Social Needs  . Financial resource strain: Not on file  . Food insecurity - worry: Not on file  . Food insecurity - inability: Not on file  . Transportation needs - medical: Not on file  . Transportation needs -  non-medical: Not on file  Occupational History  . Not on file  Tobacco Use  . Smoking status: Never Smoker  . Smokeless tobacco: Never Used  Substance and Sexual Activity  . Alcohol use: No    Alcohol/week: 0.0 oz  . Drug use: No  . Sexual activity: Not on file  Other Topics Concern  . Not on file  Social History Narrative  . Not on file    Family History  Problem Relation Age of Onset  . Hypertension Mother   . Dementia Mother   . Thyroid disease Mother   . Stroke Father     Review of Systems: A 12-system review of systems was performed and was negative except as noted in the HPI.  --------------------------------------------------------------------------------------------------  Physical Exam: BP 112/60 (BP Location: Left Arm, Patient Position: Sitting, Cuff Size: Normal)   Pulse 85   Ht _0  (1.575 m)   Wt 150 lb 4 oz (68.2 kg)   BMI 27.48 kg/m   General: Well-developed, well-nourished woman, seated comfortably in the exam room. HEENT: No conjunctival pallor or scleral icterus. Moist mucous membranes.  OP clear. Neck: Supple without lymphadenopathy, thyromegaly, JVD, or HJR.  Lungs: Normal work of breathing. Clear to auscultation bilaterally without wheezes or crackles. Heart: Regular rate and rhythm without murmurs, rubs, or gallops. Non-displaced PMI. Abd: Bowel sounds present. Soft, NT/ND without hepatosplenomegaly Ext: No lower extremity edema. Radial, PT, and DP pulses are 2+ bilaterally. Skin: Warm and dry without rash.  EKG: Normal sinus rhythm with low voltage QRS.  Otherwise, no significant abnormalities.  Lab Results  Component Value Date   WBC 11.0 12/13/2016   HGB 16.4 (H) 12/13/2016   HCT 47.4 (H) 12/13/2016   MCV 88.1 12/13/2016   PLT 236 12/13/2016    Lab Results  Component Value Date   NA 140 12/13/2016   K 3.6 12/13/2016   CL 103 12/13/2016   CO2 31 12/13/2016   BUN 24 (H) 12/13/2016   CREATININE 0.95 12/13/2016   GLUCOSE 110 (H)  12/13/2016   ALT 47 01/13/2015    No results found for: CHOL, HDL, LDLCALC, LDLDIRECT, TRIG, CHOLHDL  --------------------------------------------------------------------------------------------------  ASSESSMENT AND PLAN: Atypical chest pain Significant improved with addition of PPI, suggesting GERD and/or esophageal spasm as underlying etiology.  Myocardial perfusion stress test was normal.  No further cardiac workup at this time.  Palpitations Minimal symptoms since her last visit.  Monitor revealed single PAC and single brief atrial run.  We have discussed medical therapy, including beta-blockade.  Given lack of further symptoms and propensity for low blood pressures, we will defer adding medication at this time.  Shortness of breath Symptoms resolved.  Echo normal.  No further workup.  Altered sensorium This has resolved with decreased dose of Ambien.  I encouraged Lisa Banks to avoid Ambien as much as possible.  Follow-up: Return to clinic as needed.  Nelva Bush, MD 02/22/2018 2:35 PM

## 2018-02-22 NOTE — Patient Instructions (Signed)
Medication Instructions:  Your physician recommends that you continue on your current medications as directed. Please refer to the Current Medication list given to you today.   Labwork: NONE  Testing/Procedures: NONE  Follow-Up: Your physician recommends that you schedule a follow-up appointment in: AS NEEDED.

## 2018-02-28 ENCOUNTER — Ambulatory Visit: Payer: Medicare Other | Admitting: Neurology

## 2018-06-08 IMAGING — CR DG CHEST 2V
1 series · 2 of 2 positions shown · non-contrast
Comparison: No recent prior .

CLINICAL DATA: Chest pain .

EXAM:
CHEST  2 VIEW

[Series 1: dg chest 2 view · 0.14mm/px · 2 of 2 slices shown]
[im 1/2]
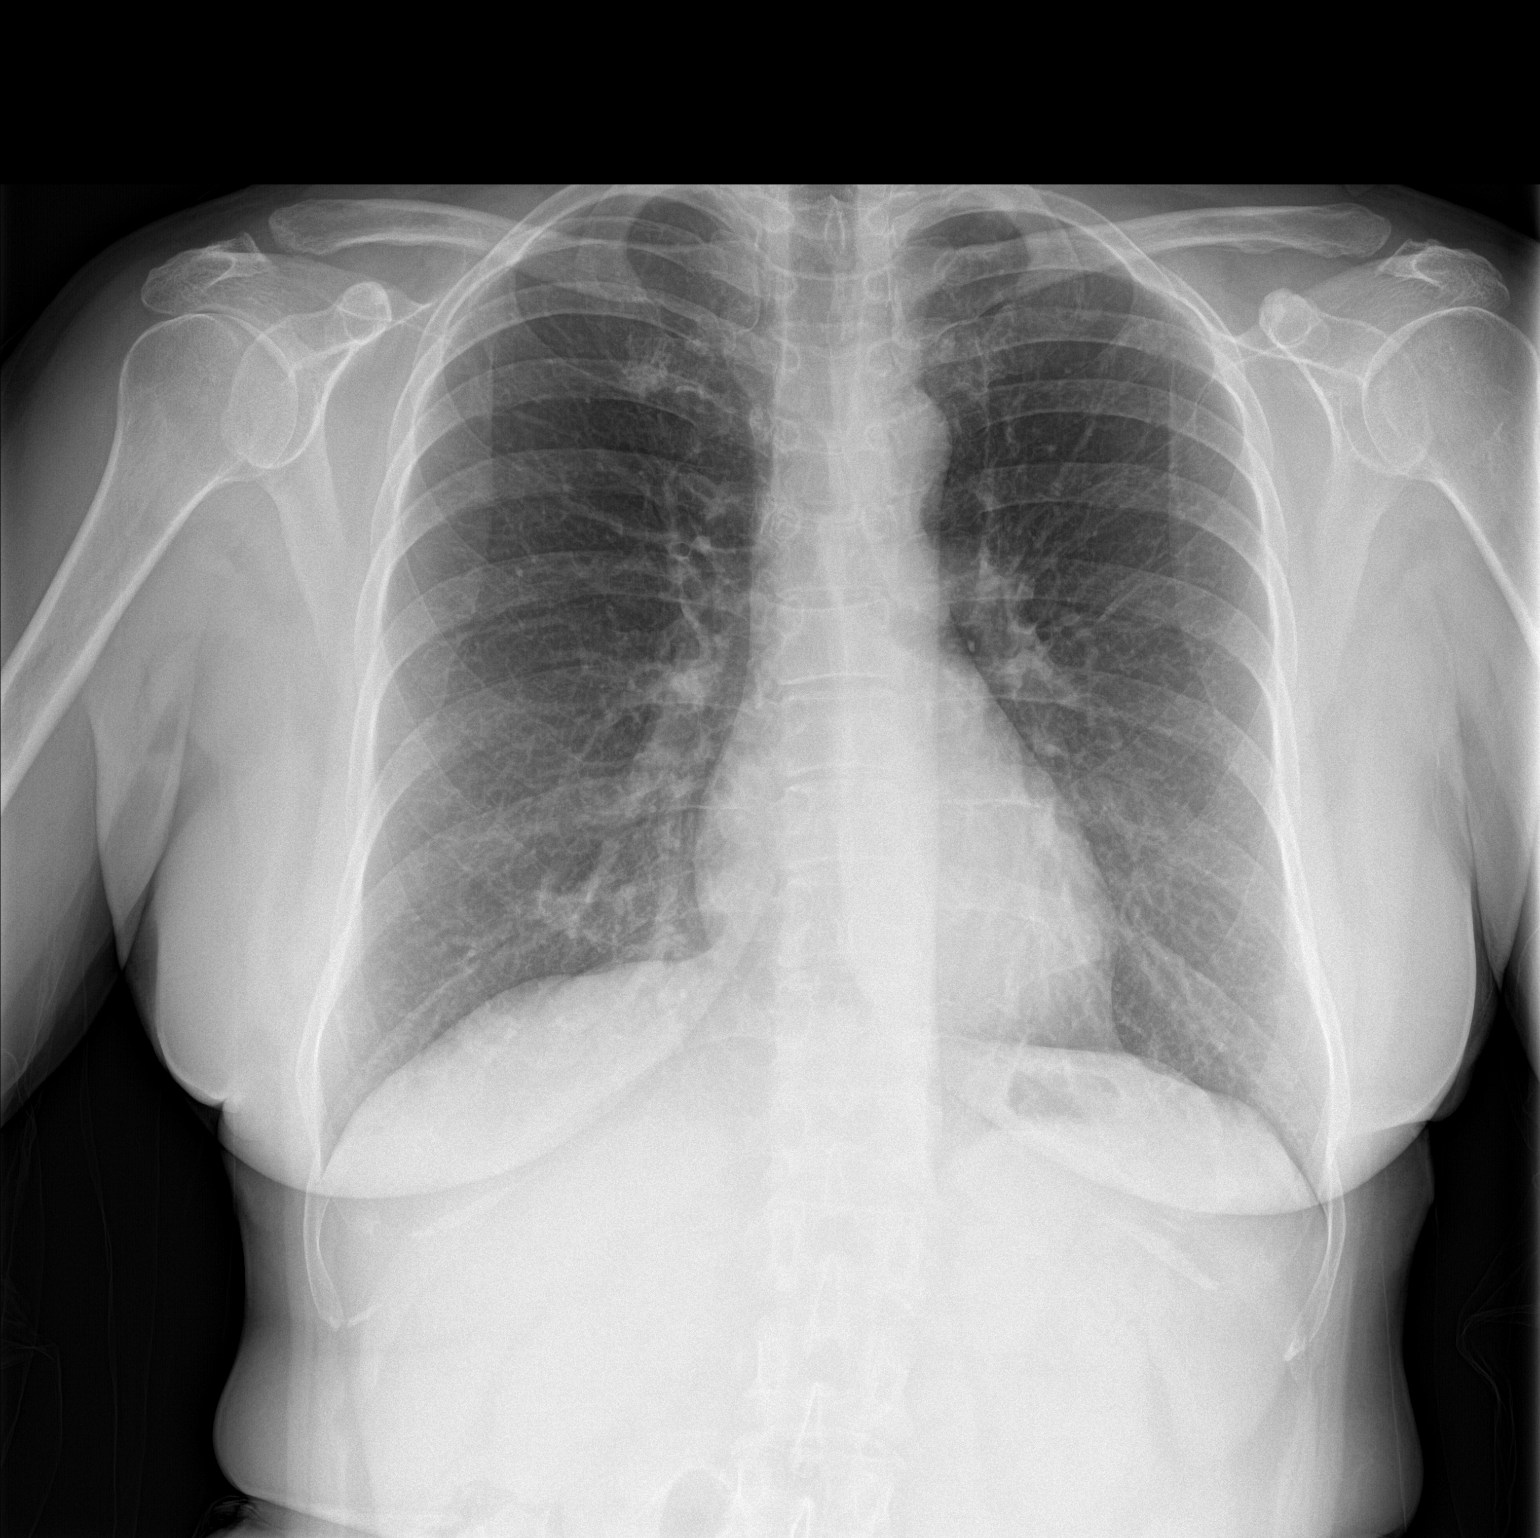
[im 2/2]
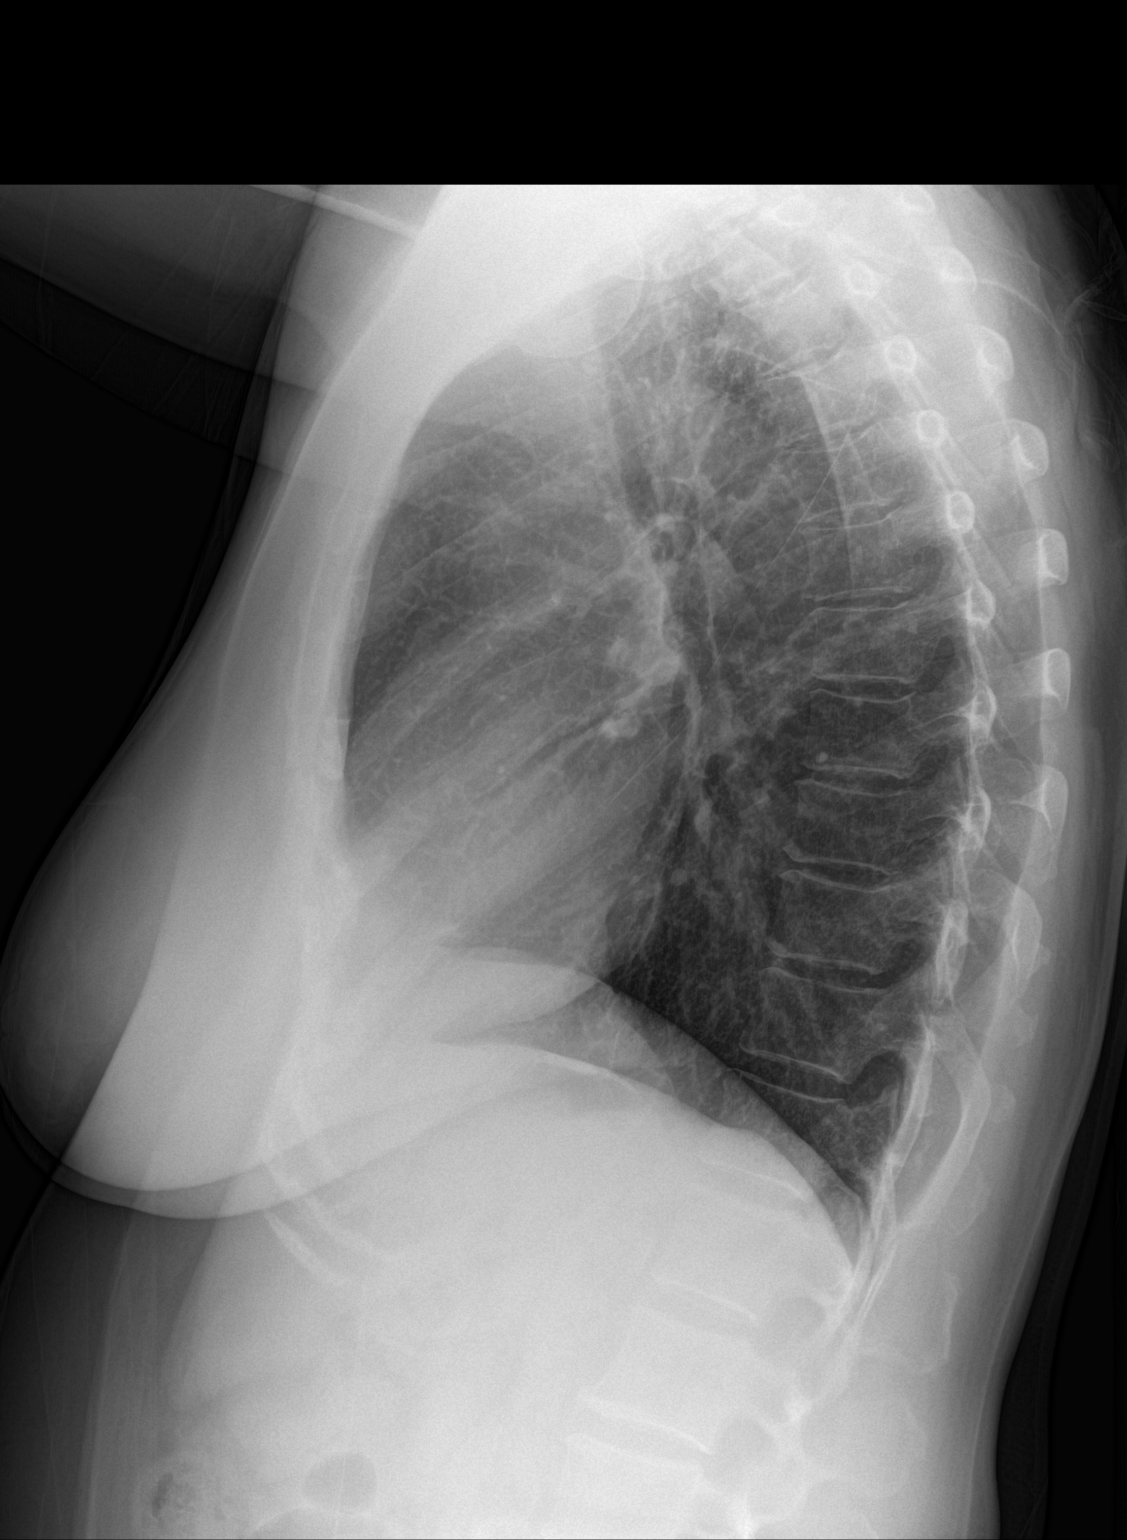

[2 of 2 positions shown; findings below may reference images not displayed]

FINDINGS: Mediastinum hilar structures are normal. Lungs are clear. No focal
infiltrate. No pleural effusion or pneumothorax.
IMPRESSION: No acute abnormality identified.

## 2018-09-27 ENCOUNTER — Other Ambulatory Visit: Payer: Self-pay | Admitting: Physical Medicine and Rehabilitation

## 2018-09-27 DIAGNOSIS — M5412 Radiculopathy, cervical region: Secondary | ICD-10-CM

## 2018-10-17 ENCOUNTER — Ambulatory Visit: Payer: Medicare Other

## 2018-10-19 ENCOUNTER — Ambulatory Visit
Admission: RE | Admit: 2018-10-19 | Discharge: 2018-10-19 | Disposition: A | Discharge status: Home / Self Care | Payer: Medicare Other | Source: Ambulatory Visit | Attending: Physical Medicine and Rehabilitation | Admitting: Physical Medicine and Rehabilitation

## 2018-10-19 DIAGNOSIS — M2548 Effusion, other site: Secondary | ICD-10-CM | POA: Insufficient documentation

## 2018-10-19 DIAGNOSIS — M4802 Spinal stenosis, cervical region: Secondary | ICD-10-CM | POA: Insufficient documentation

## 2018-10-19 DIAGNOSIS — M5412 Radiculopathy, cervical region: Secondary | ICD-10-CM

## 2018-10-19 DIAGNOSIS — M4682 Other specified inflammatory spondylopathies, cervical region: Secondary | ICD-10-CM | POA: Diagnosis not present

## 2018-10-19 DIAGNOSIS — M5030 Other cervical disc degeneration, unspecified cervical region: Secondary | ICD-10-CM | POA: Insufficient documentation

## 2018-11-08 ENCOUNTER — Other Ambulatory Visit: Payer: Self-pay | Admitting: Neurology

## 2018-11-19 ENCOUNTER — Other Ambulatory Visit: Payer: Self-pay | Admitting: Neurology

## 2018-11-21 ENCOUNTER — Other Ambulatory Visit: Payer: Self-pay | Admitting: Neurology

## 2018-11-21 MED ORDER — FREMANEZUMAB-VFRM 225 MG/1.5ML ~~LOC~~ SOSY: 3.0000 | PREFILLED_SYRINGE | SUBCUTANEOUS | 0 refills | Status: DC

## 2018-11-21 NOTE — Addendum Note (Signed)
Addended by: Desmond Lope on: 11/21/2018 04:32 PM   Modules accepted: Orders

## 2018-11-21 NOTE — Telephone Encounter (Signed)
Pt is asking for a refill on her Fremanezumab-vfrm (AJOVY) 225 MG/1.5ML SOSY,  [place] #[id number] she has scheduled her 1 year f/u for 02-06-2019

## 2018-11-21 NOTE — Telephone Encounter (Signed)
Patient scheduled appt on 02/06/18.  Rx sent to pharmacy.

## 2018-11-21 NOTE — Addendum Note (Signed)
Addended by: Noberto Retort C on: 11/21/2018 04:30 PM   Modules accepted: Orders

## 2019-01-05 ENCOUNTER — Other Ambulatory Visit: Payer: Self-pay | Admitting: Neurology

## 2019-02-04 ENCOUNTER — Other Ambulatory Visit: Payer: Self-pay | Admitting: Neurology

## 2019-02-06 ENCOUNTER — Ambulatory Visit: Payer: Medicare Other | Admitting: Neurology

## 2019-02-07 ENCOUNTER — Encounter: Payer: Self-pay | Admitting: Neurology

## 2019-09-10 ENCOUNTER — Telehealth: Payer: Self-pay | Admitting: *Deleted

## 2019-09-10 ENCOUNTER — Ambulatory Visit (INDEPENDENT_AMBULATORY_CARE_PROVIDER_SITE_OTHER): Payer: Medicare Other | Admitting: Neurology

## 2019-09-10 ENCOUNTER — Encounter: Payer: Self-pay | Admitting: Neurology

## 2019-09-10 ENCOUNTER — Other Ambulatory Visit: Payer: Self-pay

## 2019-09-10 VITALS — BP 118/78 | HR 75 | Ht 61.5 in | Wt 144.5 lb

## 2019-09-10 DIAGNOSIS — M542 Cervicalgia: Secondary | ICD-10-CM

## 2019-09-10 DIAGNOSIS — G43909 Migraine, unspecified, not intractable, without status migrainosus: Secondary | ICD-10-CM | POA: Diagnosis not present

## 2019-09-10 DIAGNOSIS — G4700 Insomnia, unspecified: Secondary | ICD-10-CM

## 2019-09-10 MED ORDER — ELETRIPTAN HYDROBROMIDE 40 MG PO TABS: ORAL_TABLET | ORAL | 3 refills | Status: DC

## 2019-09-10 MED ORDER — NORTRIPTYLINE HCL 25 MG PO CAPS: 25.0000 mg | ORAL_CAPSULE | Freq: Every day | ORAL | 5 refills | Status: DC

## 2019-09-10 NOTE — Progress Notes (Signed)
[facility]  PATIENT: Lisa Banks DOB: Dec 02, 1951  REFERRING DOCTOR OR PCP:  Mosetta Anis, MD [facility]) SOURCE: patient, records from Dr. Baltazar Najjar  _________________________________   HISTORICAL  CHIEF COMPLAINT:  Chief Complaint  Patient presents with   Follow-up    Dr. Greer Pickerel new room, alone. Last seen 10/27/2017. Here to f/u on migraines.    Migraine    Out of relpax, zofran, Ajovy.     HISTORY OF PRESENT ILLNESS:  Lisa Banks is a 68 yo woman with headaches and insomnia.     Update 09/10/2019: Her migraines improved after the last visit.   However,she had a holocephalic headache. It was pounding and aching.   It was severe x 3 days and now much better.  She has a pinching pain in her left neck.   A cervical spine MRI showed facet hypertrophy to the left at C4C5.  I personally reviewed the images and her pain management notes.    She had a benefit from the MBB's but not the RFA rhizotomy (C4 and C5) by Dr. Sharlet Salina [facility])  The neck pain is worse with left rotation and returning to neutral from left lateral flexed.    She still gets some intermittent migraines and Relpax has been the most useful for her.  She needs a refill.  Her insomnia is doing well on Ambien 5 mg nightly.     From 10/27/2017: HA:   She is having fequent headaches occurring 2-3 times a week (8-12 days/month for greater than 4 hours).      They are mostly random though some are triggered when she gets angry.   Pain often starts behind hr er eyes but current pain is in the left occiput.    She gets nausea but not vomiting.    She gets photophobia and phonophobia. Relpax usually helps but she takes 2 some days and uses all 12/month she is prescribed.     In the past, she sometimes got longer term benefit with an occipital nerve block.  Trokendi helped the frequency but cognition was fuzzy.   Botox had helped in the past  Headache history: She has had migraine headaches  since she was in her early teen years about 50 years ago. Initially, her migraines were intermittent and mostly associated with her period.   Then, in her 54s the headaches would also be triggered by stress and began to become more frequent. Sometimes she would have a week or 2 of headache in a row and then be better for a while. By her late 44s, the headaches were near daily. Typically, she would have a dull pain in the back of her head that would intensify on a near daily basis to include the entire head with pounding pain.  She would have nausea and sometimes vomiting. Additionally she had photophobia and phonophobia. Moving would make the headache worse.  Resting in a quiet dark room would help. She was placed on Relpax at some point and that would help the headaches but the headaches often came back the next day. Several years ago, when she was seen Dr. Ermalene Postin, she took Botox for a couple years. That helped her headaches. She stopped Botox around 2014. She then started to see Dr. Baltazar Najjar and she placed her on Trokendi with Relpax as a rescue medication. That worked rather well for her until recently.    In early May, she had a tic bite. A couple days later she began to  have a headache and the headache has been near daily since then. The headache is a little bit different than her typical migraine headaches in that it is more dull in the head and over the eyes without nausea and with less photophobia and phonophobia. Relpax helps but the headache will come back the next day.  Neck pain: She has pain in the occiput and upper paraspinal muscles, right greater than left. In the past, trigger point injections have helped. Often the neck pain procedure headache.    Insomnia/sleep issues: She has a long history of insomnia with difficulty with sleep onset and sleep maintenance. She is back on Ambien 5 mg (Belsomra caused weird dreams).      Many years ago she had a sleep study at the [facility] headache clinic and was  told that not reach deep sleep.   She snores and is sleepy.   She talks in her sleep and rarely acts out a dream (for many years > 10).    Mood: She feels mood is better on Cymbalta.   She is better on 60 mg than a lower dose.    REVIEW OF SYSTEMS: Constitutional: No fevers, chills, sweats, or change in appetite.  Reports insomnia Eyes: No visual changes, double vision, eye pain Ear, nose and throat: No hearing loss, ear pain, nasal congestion, sore throat Cardiovascular: No chest pain, palpitations Respiratory: No shortness of breath at rest or with exertion.   No wheezes GastrointestinaI: No nausea, vomiting, diarrhea, abdominal pain, fecal incontinence Genitourinary: No dysuria, urinary retention or frequency.  No nocturia. Musculoskeletal: No neck pain, back pain Integumentary: No rash, pruritus, skin lesions Neurological: as above Psychiatric: Has anxiety but No depression Endocrine: No palpitations, diaphoresis, change in appetite, change in weigh or increased thirst Hematologic/Lymphatic: No anemia, purpura, petechiae. Allergic/Immunologic: No itchy/runny eyes, nasal congestion, recent allergic reactions, rashes  ALLERGIES: Allergies  Allergen Reactions   Livalo [Pitavastatin]     HOME MEDICATIONS:  Current Outpatient Medications:    buPROPion (WELLBUTRIN XL) 300 MG 24 hr tablet, TK 1 T PO QD IN THE MORNING, Disp: , Rfl:    Calcium Carbonate-Vitamin D (CALCIUM HIGH POTENCY/VITAMIN D) 600-200 MG-UNIT TABS, Take by mouth., Disp: , Rfl:    eletriptan (RELPAX) 40 MG tablet, TAKE 1 TABLET BY MOUTH AT ONSET OF MIGRAINE, MAY REPEAT IN 2 HOURS IF NEEDED, Disp: 12 tablet, Rfl: 3   Fremanezumab-vfrm (AJOVY) 225 MG/1.5ML SOSY, Inject 3 Syringes into the skin every 3 (three) months. Please keep follow up scheduled on 02/06/2019., Disp: 3 Syringe, Rfl: 0   ondansetron (ZOFRAN ODT) 4 MG disintegrating tablet, Take 1 tablet (4 mg total) by mouth every 8 (eight) hours as needed for  nausea or vomiting., Disp: 20 tablet, Rfl: 0   zolpidem (AMBIEN) 5 MG tablet, Take 5 mg by mouth at bedtime as needed for sleep., Disp: , Rfl:    nortriptyline (PAMELOR) 25 MG capsule, Take 1 capsule (25 mg total) by mouth at bedtime., Disp: 30 capsule, Rfl: 5  PAST MEDICAL HISTORY: Past Medical History:  Diagnosis Date   Cancer ([place])    Headache    High cholesterol    Skin cancer    Vision abnormalities     PAST SURGICAL HISTORY: Past Surgical History:  Procedure Laterality Date   BLADDER REPAIR W/ CESAREAN SECTION     HYSTERECTOMY ABDOMINAL WITH SALPINGECTOMY     TONSILLECTOMY      FAMILY HISTORY: Family History  Problem Relation Age of Onset   Hypertension  Mother    Dementia Mother    Thyroid disease Mother    Stroke Father     SOCIAL HISTORY:  Social History   Socioeconomic History   Marital status: Married    Spouse name: Not on file   Number of children: Not on file   Years of education: Not on file   Highest education level: Not on file  Occupational History   Not on file  Social Needs   Financial resource strain: Not on file   Food insecurity    Worry: Not on file    Inability: Not on file   Transportation needs    Medical: Not on file    Non-medical: Not on file  Tobacco Use   Smoking status: Never Smoker   Smokeless tobacco: Never Used  Substance and Sexual Activity   Alcohol use: No    Alcohol/week: 0.0 standard drinks   Drug use: No   Sexual activity: Not on file  Lifestyle   Physical activity    Days per week: Not on file    Minutes per session: Not on file   Stress: Not on file  Relationships   Social connections    Talks on phone: Not on file    Gets together: Not on file    Attends religious service: Not on file    Active member of club or organization: Not on file    Attends meetings of clubs or organizations: Not on file    Relationship status: Not on file   Intimate partner violence    Fear of  current or ex partner: Not on file    Emotionally abused: Not on file    Physically abused: Not on file    Forced sexual activity: Not on file  Other Topics Concern   Not on file  Social History Narrative   Not on file     PHYSICAL EXAM  Vitals:   09/10/19 1258  BP: 118/78  Pulse: 75  SpO2: 98%  Weight: 144 lb 8 oz (65.5 kg)  Height: 5' 1.5" (1.562 m)    Body mass index is 26.86 kg/m.   General: The patient is well-developed and well-nourished and in no acute distress  Neck: The neck is supple, no carotid bruits are noted.  The neck is tender over the left occiput more than the paraspinal muscles.  Range of motion is reduced.  She gets tenderness when she straightens her head after flexing to the left and when she rotates to the left..   Neurologic Exam  Mental status: The patient is alert and oriented x 3 at the time of the examination. The patient has apparent normal recent and remote memory, with an apparently normal attention span and concentration ability.   Speech is normal.  Cranial nerves: Extraocular movements are full. Facial strength and sensation is normal. Trapezius strength is normal.  The tongue is midline, and the patient has symmetric elevation of the soft palate. No obvious hearing deficits are noted.  Motor:  Muscle bulk is normal.   Tone is normal. Strength is  5 / 5 in all 4 extremities.   Sensory: Sensory testing is intact to touch and vibration sensation in all 4 extremities.  Coordination: Cerebellar testing reveals good finger-nose-finger and heel-to-shin bilaterally.  Gait and station: Station is normal.   Gait and tandem gait are normal.. Romberg is negative.   Reflexes: Deep tendon reflexes are symmetric and normal bilaterally.      DIAGNOSTIC DATA (LABS, IMAGING, TESTING) -  I reviewed patient records, labs, notes, testing and imaging myself where available.  Lab Results  Component Value Date   WBC 11.0 12/13/2016   HGB 16.4 (H)  12/13/2016   HCT 47.4 (H) 12/13/2016   MCV 88.1 12/13/2016   PLT 236 12/13/2016      Component Value Date/Time   NA 140 12/13/2016 1003   NA 139 01/13/2015 0847   K 3.6 12/13/2016 1003   K 3.9 01/13/2015 0847   CL 103 12/13/2016 1003   CL 107 01/13/2015 0847   CO2 31 12/13/2016 1003   CO2 24 01/13/2015 0847   GLUCOSE 110 (H) 12/13/2016 1003   GLUCOSE 96 01/13/2015 0847   BUN 24 (H) 12/13/2016 1003   BUN 10 01/13/2015 0847   CREATININE 0.95 12/13/2016 1003   CREATININE 1.19 01/13/2015 0847   CALCIUM 8.8 (L) 12/13/2016 1003   CALCIUM 8.3 (L) 01/13/2015 0847   PROT 6.9 01/13/2015 0847   ALBUMIN 3.7 01/13/2015 0847   AST 18 01/13/2015 0847   ALT 47 01/13/2015 0847   ALKPHOS 67 01/13/2015 0847   BILITOT 0.3 01/13/2015 0847   GFRNONAA >60 12/13/2016 1003   GFRNONAA 49 (L) 01/13/2015 0847   GFRAA >60 12/13/2016 1003   GFRAA 59 (L) 01/13/2015 0847       ASSESSMENT AND PLAN  Migraine without status migrainosus, not intractable, unspecified migraine type  Neck pain  Insomnia, unspecified type   1.    Nortriptyline 25 mg nightly for migraines and neck pain.. 2.   The MRI of the cervical spine shows significant facet hypertrophy at C4-C5 to the left.  That likely is playing some role in her pain.  If she does not improve with the nortriptyline she may want to discuss further with her pain management doctor and also consider neurosurgical consultation if she gets no benefit from a second RFA.Marland Kitchen 3    Continue Relpax for intermittent classic migraine.   4.   Return in 6 months or sooner if there are new or worsening neurologic symptoms.   Selassie Spatafore A. Felecia Shelling, MD, PhD [id number], [id number] PM Certified in Neurology, Clinical Neurophysiology, Sleep Medicine, Pain Medicine and Neuroimaging  [facility] [address], [place], [place] [address] [phone]

## 2019-09-10 NOTE — Telephone Encounter (Signed)
Submitted PA nortriptyline on CMM.  Key: [id number] - PA Case ID: [id number] - Rx #[id number]. Waiting on determination from [facility].

## 2019-09-12 ENCOUNTER — Other Ambulatory Visit: Payer: Self-pay | Admitting: Neurology

## 2019-09-12 MED ORDER — TOPIRAMATE 50 MG PO TABS: ORAL_TABLET | ORAL | 5 refills | Status: DC

## 2019-09-12 NOTE — Telephone Encounter (Signed)
Faxed [facility] message that PA nortriptyline denied and MD sent in rx topamax instead. Asked that rx nortiptyline be d/c'd. Fax: [phone]. Received fax confirmation.

## 2019-12-13 ENCOUNTER — Other Ambulatory Visit: Payer: Self-pay | Admitting: Student

## 2019-12-13 DIAGNOSIS — M5412 Radiculopathy, cervical region: Secondary | ICD-10-CM

## 2019-12-31 ENCOUNTER — Ambulatory Visit: Payer: Medicare PPO

## 2020-01-07 ENCOUNTER — Other Ambulatory Visit: Payer: Self-pay

## 2020-01-07 ENCOUNTER — Ambulatory Visit
Admission: RE | Admit: 2020-01-07 | Discharge: 2020-01-07 | Disposition: A | Discharge status: Home / Self Care | Payer: Medicare PPO | Source: Ambulatory Visit | Attending: Student | Admitting: Student

## 2020-01-07 DIAGNOSIS — M5412 Radiculopathy, cervical region: Secondary | ICD-10-CM

## 2020-01-24 ENCOUNTER — Ambulatory Visit: Payer: Medicare PPO

## 2020-02-01 ENCOUNTER — Other Ambulatory Visit: Payer: Self-pay | Admitting: Neurology

## 2020-02-01 ENCOUNTER — Ambulatory Visit: Payer: Medicare PPO | Attending: Internal Medicine

## 2020-02-01 DIAGNOSIS — Z23 Encounter for immunization: Secondary | ICD-10-CM | POA: Insufficient documentation

## 2020-02-01 NOTE — Progress Notes (Signed)
   Covid-19 Vaccination Clinic  Name:  Lisa Banks    MRN: [id number] DOB: 12/06/1951  02/01/2020  [name] was observed post Covid-19 immunization for 15 minutes without incidence. She was provided with Vaccine Information Sheet and instruction to access the V-Safe system.   [name] was instructed to call 911 with any severe reactions post vaccine: Marland Kitchen Difficulty breathing  . Swelling of your face and throat  . A fast heartbeat  . A bad rash all over your body  . Dizziness and weakness    Immunizations Administered    Name Date Dose VIS Date Route   Pfizer COVID-19 Vaccine 02/01/2020  5:23 PM 0.3 mL 12/07/2019 Intramuscular   Manufacturer: [place]   Lot: [id number]   [place]: [id number]

## 2020-02-04 ENCOUNTER — Ambulatory Visit: Payer: Medicare PPO

## 2020-02-26 ENCOUNTER — Ambulatory Visit: Payer: Medicare PPO | Attending: Internal Medicine

## 2020-02-26 ENCOUNTER — Ambulatory Visit: Payer: Medicare PPO

## 2020-02-26 DIAGNOSIS — Z23 Encounter for immunization: Secondary | ICD-10-CM | POA: Insufficient documentation

## 2020-02-26 NOTE — Progress Notes (Signed)
   Covid-19 Vaccination Clinic  Name:  JIHANNA FASNACHT    MRN: [id number] DOB: 05/06/1951  02/26/2020  Ms. Vedder was observed post Covid-19 immunization for 15 minutes without incident. She was provided with Vaccine Information Sheet and instruction to access the V-Safe system.   Ms. Vacanti was instructed to call 911 with any severe reactions post vaccine: Marland Kitchen Difficulty breathing  . Swelling of face and throat  . A fast heartbeat  . A bad rash all over body  . Dizziness and weakness   Immunizations Administered    Name Date Dose VIS Date Route   Pfizer COVID-19 Vaccine 02/26/2020 10:19 AM 0.3 mL 12/07/2019 Intramuscular   Manufacturer: [place]   Lot: [id number]   [place]: [id number]

## 2020-03-10 ENCOUNTER — Encounter: Payer: Self-pay | Admitting: Neurology

## 2020-03-10 ENCOUNTER — Ambulatory Visit: Payer: Medicare PPO | Admitting: Neurology

## 2020-03-10 ENCOUNTER — Other Ambulatory Visit: Payer: Self-pay

## 2020-03-10 VITALS — BP 118/66 | HR 78 | Temp 96.8°F | Ht 61.5 in | Wt 151.0 lb

## 2020-03-10 DIAGNOSIS — M542 Cervicalgia: Secondary | ICD-10-CM

## 2020-03-10 DIAGNOSIS — M47812 Spondylosis without myelopathy or radiculopathy, cervical region: Secondary | ICD-10-CM | POA: Diagnosis not present

## 2020-03-10 DIAGNOSIS — G43909 Migraine, unspecified, not intractable, without status migrainosus: Secondary | ICD-10-CM

## 2020-03-10 DIAGNOSIS — Z79899 Other long term (current) drug therapy: Secondary | ICD-10-CM | POA: Diagnosis not present

## 2020-03-10 MED ORDER — ELETRIPTAN HYDROBROMIDE 40 MG PO TABS: ORAL_TABLET | ORAL | 11 refills | Status: DC

## 2020-03-10 MED ORDER — MELOXICAM 7.5 MG PO TABS: 7.5000 mg | ORAL_TABLET | Freq: Every day | ORAL | 5 refills | Status: DC

## 2020-03-10 NOTE — Progress Notes (Signed)
[facility]  PATIENT: Lisa Banks DOB: 1951/03/02  REFERRING DOCTOR OR PCP:  Mosetta Anis, MD [facility]) SOURCE: patient, records from Dr. Baltazar Najjar  _________________________________   HISTORICAL  CHIEF COMPLAINT:  Chief Complaint  Patient presents with  . Follow-up    RM 13, alone. Last seen 09/10/2019.     HISTORY OF PRESENT ILLNESS:  Lisa Banks is a 69 yo woman with headaches and insomnia.     Update 03/10/2020: Her migraines are doing better.    She takes Relpax twice a week with benefit.   She stopped nortriptyline and topiramate as she is doing better.     Her neck pain is increasing some.   Pain is worse with movements and if she plays with grand-kids and is more active, the next day, pain is worse.   She has C4C5 facet hypertrophy but RFA of medial branch nerves did not help much.    She saw surgery but is reluctant to proceed with surgery.   She takes prn tramadol and tizanidine.    NSAIDs helped some but kidney tests were affected and she stopped.    She had her Covid vaccination  Update 09/10/2019: Her migraines improved after the last visit.   However,she had a holocephalic headache. It was pounding and aching.   It was severe x 3 days and now much better.  She has a pinching pain in her left neck.   A cervical spine MRI showed facet hypertrophy to the left at C4C5.  I personally reviewed the images and her pain management notes.    She had a benefit from the MBB's but not the RFA rhizotomy (C4 and C5) by Dr. Sharlet Salina [facility])  The neck pain is worse with left rotation and returning to neutral from left lateral flexed.    She still gets some intermittent migraines and Relpax has been the most useful for her.  She needs a refill.  Her insomnia is doing well on Ambien 5 mg nightly.     From 10/27/2017: HA:   She is having fequent headaches occurring 2-3 times a week (8-12 days/month for greater than 4 hours).      They are  mostly random though some are triggered when she gets angry.   Pain often starts behind hr er eyes but current pain is in the left occiput.    She gets nausea but not vomiting.    She gets photophobia and phonophobia. Relpax usually helps but she takes 2 some days and uses all 12/month she is prescribed.     In the past, she sometimes got longer term benefit with an occipital nerve block.  Trokendi helped the frequency but cognition was fuzzy.   Botox had helped in the past  Headache history: She has had migraine headaches since she was in her early teen years about 50 years ago. Initially, her migraines were intermittent and mostly associated with her period.   Then, in her 65s the headaches would also be triggered by stress and began to become more frequent. Sometimes she would have a week or 2 of headache in a row and then be better for a while. By her late 68s, the headaches were near daily. Typically, she would have a dull pain in the back of her head that would intensify on a near daily basis to include the entire head with pounding pain.  She would have nausea and sometimes vomiting. Additionally she had photophobia and phonophobia. Moving would make  the headache worse.  Resting in a quiet dark room would help. She was placed on Relpax at some point and that would help the headaches but the headaches often came back the next day. Several years ago, when she was seen Dr. Ermalene Postin, she took Botox for a couple years. That helped her headaches. She stopped Botox around 2014. She then started to see Dr. Baltazar Najjar and she placed her on Trokendi with Relpax as a rescue medication. That worked rather well for her until recently.    In early May, she had a tic bite. A couple days later she began to have a headache and the headache has been near daily since then. The headache is a little bit different than her typical migraine headaches in that it is more dull in the head and over the eyes without nausea and with less  photophobia and phonophobia. Relpax helps but the headache will come back the next day.  Neck pain: She has pain in the occiput and upper paraspinal muscles, right greater than left. In the past, trigger point injections have helped. Often the neck pain procedure headache.    Insomnia/sleep issues: She has a long history of insomnia with difficulty with sleep onset and sleep maintenance. She is back on Ambien 5 mg (Belsomra caused weird dreams).      Many years ago she had a sleep study at the [facility] headache clinic and was told that not reach deep sleep.   She snores and is sleepy.   She talks in her sleep and rarely acts out a dream (for many years > 10).    Mood: She feels mood is better on Cymbalta.   She is better on 60 mg than a lower dose.    REVIEW OF SYSTEMS: Constitutional: No fevers, chills, sweats, or change in appetite.  Reports insomnia Eyes: No visual changes, double vision, eye pain Ear, nose and throat: No hearing loss, ear pain, nasal congestion, sore throat Cardiovascular: No chest pain, palpitations Respiratory: No shortness of breath at rest or with exertion.   No wheezes GastrointestinaI: No nausea, vomiting, diarrhea, abdominal pain, fecal incontinence Genitourinary: No dysuria, urinary retention or frequency.  No nocturia. Musculoskeletal: No neck pain, back pain Integumentary: No rash, pruritus, skin lesions Neurological: as above Psychiatric: Has anxiety but No depression Endocrine: No palpitations, diaphoresis, change in appetite, change in weigh or increased thirst Hematologic/Lymphatic: No anemia, purpura, petechiae. Allergic/Immunologic: No itchy/runny eyes, nasal congestion, recent allergic reactions, rashes  ALLERGIES: No Known Allergies  HOME MEDICATIONS:  Current Outpatient Medications:  .  buPROPion (WELLBUTRIN XL) 300 MG 24 hr tablet, TK 1 T PO QD IN THE MORNING, Disp: , Rfl:  .  Calcium Carbonate-Vitamin D (CALCIUM HIGH POTENCY/VITAMIN D)  600-200 MG-UNIT TABS, Take by mouth., Disp: , Rfl:  .  eletriptan (RELPAX) 40 MG tablet, TAKE 1 TABLET BY MOUTH AT ONSET OF MIGRAINE. MAY REPEAT IN 2 HOURS AS NEEDED, Disp: 12 tablet, Rfl: 11 .  escitalopram (LEXAPRO) 10 MG tablet, Take 10 mg by mouth daily., Disp: , Rfl:  .  ondansetron (ZOFRAN ODT) 4 MG disintegrating tablet, Take 1 tablet (4 mg total) by mouth every 8 (eight) hours as needed for nausea or vomiting., Disp: 20 tablet, Rfl: 0 .  tiZANidine (ZANAFLEX) 4 MG tablet, Take 4 mg by mouth as needed for muscle spasms., Disp: , Rfl:  .  topiramate (TOPAMAX) 50 MG tablet, One po qHS, Disp: 30 tablet, Rfl: 5 .  traMADol (ULTRAM) 50 MG tablet, Take  by mouth as needed., Disp: , Rfl:  .  zolpidem (AMBIEN) 5 MG tablet, Take 5 mg by mouth at bedtime as needed for sleep., Disp: , Rfl:  .  meloxicam (MOBIC) 7.5 MG tablet, Take 1 tablet (7.5 mg total) by mouth daily., Disp: 30 tablet, Rfl: 5  PAST MEDICAL HISTORY: Past Medical History:  Diagnosis Date  . Cancer ([place])   . Headache   . High cholesterol   . Skin cancer   . Vision abnormalities     PAST SURGICAL HISTORY: Past Surgical History:  Procedure Laterality Date  . BLADDER REPAIR W/ CESAREAN SECTION    . HYSTERECTOMY ABDOMINAL WITH SALPINGECTOMY    . TONSILLECTOMY      FAMILY HISTORY: Family History  Problem Relation Age of Onset  . Hypertension Mother   . Dementia Mother   . Thyroid disease Mother   . Stroke Father     SOCIAL HISTORY:  Social History   Socioeconomic History  . Marital status: Married    Spouse name: Not on file  . Number of children: Not on file  . Years of education: Not on file  . Highest education level: Not on file  Occupational History  . Not on file  Tobacco Use  . Smoking status: Never Smoker  . Smokeless tobacco: Never Used  Substance and Sexual Activity  . Alcohol use: No    Alcohol/week: 0.0 standard drinks  . Drug use: No  . Sexual activity: Not on file  Other Topics Concern  .  Not on file  Social History Narrative  . Not on file   Social Determinants of Health   Financial Resource Strain:   . Difficulty of Paying Living Expenses:   Food Insecurity:   . Worried About Charity fundraiser in the Last Year:   . Arboriculturist in the Last Year:   Transportation Needs:   . Film/video editor (Medical):   Marland Kitchen Lack of Transportation (Non-Medical):   Physical Activity:   . Days of Exercise per Week:   . Minutes of Exercise per Session:   Stress:   . Feeling of Stress :   Social Connections:   . Frequency of Communication with Friends and Family:   . Frequency of Social Gatherings with Friends and Family:   . Attends Religious Services:   . Active Member of Clubs or Organizations:   . Attends Archivist Meetings:   Marland Kitchen Marital Status:   Intimate Partner Violence:   . Fear of Current or Ex-Partner:   . Emotionally Abused:   Marland Kitchen Physically Abused:   . Sexually Abused:      PHYSICAL EXAM  Vitals:   03/10/20 1147  BP: 118/66  Pulse: 78  Temp: (!) 96.8 F (36 C)  Weight: 151 lb (68.5 kg)  Height: 5' 1.5" (1.562 m)    Body mass index is 28.07 kg/m.   General: The patient is well-developed and well-nourished and in no acute distress  Neck: She has some tenderness over the left occiput and adjacent paraspinal muscles.  Range of motion in the neck is mildly reduced.  Neurologic Exam  Mental status: The patient is alert and oriented x 3 at the time of the examination. The patient has apparent normal recent and remote memory, with an apparently normal attention span and concentration ability.   Speech is normal.  Cranial nerves: Extraocular movements are full. Facial strength and sensation is normal. Trapezius strength is normal.  Hearing appears normal.  Motor:  Muscle bulk is normal.   Tone is normal. Strength is  5 / 5 in all 4 extremities.   Sensory: Sensory testing is intact to touch and vibration sensation in all 4  extremities.  Coordination: Cerebellar testing reveals good finger-nose-finger and heel-to-shin bilaterally.  Gait and station: Station is normal.   Gait and tandem gait are normal.. Romberg is negative.   Reflexes: Deep tendon reflexes are symmetric and normal bilaterally.       ASSESSMENT AND PLAN  High risk medication use - Plan: Comprehensive metabolic panel  Migraine without status migrainosus, not intractable, unspecified migraine type  Neck pain  Arthropathy of cervical facet joint   1.    Mobic 7.5 mg for neck pain.   She is on escitalopram so will write the lower dose.  Check CMP in one month.   2.  Stay active and exercise as tolerated 3    Continue Relpax for intermittent classic migraine.   4.   Return in 6 months or sooner if there are new or worsening neurologic symptoms.   Dimitri Dsouza A. Felecia Shelling, MD, PhD [id number], [id number] PM Certified in Neurology, Clinical Neurophysiology, Sleep Medicine, Pain Medicine and Neuroimaging  [facility] [address] [place], [place] [address] [phone]

## 2020-04-05 DIAGNOSIS — N3000 Acute cystitis without hematuria: Secondary | ICD-10-CM | POA: Diagnosis not present

## 2020-05-16 DIAGNOSIS — N898 Other specified noninflammatory disorders of vagina: Secondary | ICD-10-CM | POA: Diagnosis not present

## 2020-05-16 DIAGNOSIS — R5383 Other fatigue: Secondary | ICD-10-CM | POA: Diagnosis not present

## 2020-05-16 DIAGNOSIS — R232 Flushing: Secondary | ICD-10-CM | POA: Diagnosis not present

## 2020-05-16 DIAGNOSIS — N951 Menopausal and female climacteric states: Secondary | ICD-10-CM | POA: Diagnosis not present

## 2020-05-20 DIAGNOSIS — R232 Flushing: Secondary | ICD-10-CM | POA: Diagnosis not present

## 2020-05-20 DIAGNOSIS — N951 Menopausal and female climacteric states: Secondary | ICD-10-CM | POA: Diagnosis not present

## 2020-05-20 DIAGNOSIS — Z72820 Sleep deprivation: Secondary | ICD-10-CM | POA: Diagnosis not present

## 2020-08-02 ENCOUNTER — Other Ambulatory Visit: Payer: Self-pay | Admitting: Neurology

## 2020-08-18 DIAGNOSIS — R232 Flushing: Secondary | ICD-10-CM | POA: Diagnosis not present

## 2020-08-18 DIAGNOSIS — N951 Menopausal and female climacteric states: Secondary | ICD-10-CM | POA: Diagnosis not present

## 2020-08-18 DIAGNOSIS — R5383 Other fatigue: Secondary | ICD-10-CM | POA: Diagnosis not present

## 2020-08-21 DIAGNOSIS — R5383 Other fatigue: Secondary | ICD-10-CM | POA: Diagnosis not present

## 2020-08-21 DIAGNOSIS — N898 Other specified noninflammatory disorders of vagina: Secondary | ICD-10-CM | POA: Diagnosis not present

## 2020-08-21 DIAGNOSIS — R232 Flushing: Secondary | ICD-10-CM | POA: Diagnosis not present

## 2020-08-21 DIAGNOSIS — N951 Menopausal and female climacteric states: Secondary | ICD-10-CM | POA: Diagnosis not present

## 2020-09-04 DIAGNOSIS — Z1389 Encounter for screening for other disorder: Secondary | ICD-10-CM | POA: Diagnosis not present

## 2020-09-04 DIAGNOSIS — F325 Major depressive disorder, single episode, in full remission: Secondary | ICD-10-CM | POA: Diagnosis not present

## 2020-09-04 DIAGNOSIS — K219 Gastro-esophageal reflux disease without esophagitis: Secondary | ICD-10-CM | POA: Diagnosis not present

## 2020-09-04 DIAGNOSIS — R05 Cough: Secondary | ICD-10-CM | POA: Diagnosis not present

## 2020-09-04 DIAGNOSIS — Z23 Encounter for immunization: Secondary | ICD-10-CM | POA: Diagnosis not present

## 2020-09-04 DIAGNOSIS — Z0000 Encounter for general adult medical examination without abnormal findings: Secondary | ICD-10-CM | POA: Diagnosis not present

## 2020-09-04 DIAGNOSIS — E782 Mixed hyperlipidemia: Secondary | ICD-10-CM | POA: Diagnosis not present

## 2020-09-04 DIAGNOSIS — G4700 Insomnia, unspecified: Secondary | ICD-10-CM | POA: Diagnosis not present

## 2020-09-05 ENCOUNTER — Other Ambulatory Visit: Payer: Medicare PPO

## 2020-09-08 DIAGNOSIS — Z20822 Contact with and (suspected) exposure to covid-19: Secondary | ICD-10-CM | POA: Diagnosis not present

## 2020-09-18 DIAGNOSIS — K219 Gastro-esophageal reflux disease without esophagitis: Secondary | ICD-10-CM | POA: Diagnosis not present

## 2020-09-18 DIAGNOSIS — R05 Cough: Secondary | ICD-10-CM | POA: Diagnosis not present

## 2020-09-18 DIAGNOSIS — R1310 Dysphagia, unspecified: Secondary | ICD-10-CM | POA: Diagnosis not present

## 2020-09-25 ENCOUNTER — Other Ambulatory Visit: Payer: Self-pay | Admitting: Neurology

## 2020-10-14 ENCOUNTER — Other Ambulatory Visit: Payer: Self-pay | Admitting: Physician Assistant

## 2020-10-14 DIAGNOSIS — R1310 Dysphagia, unspecified: Secondary | ICD-10-CM

## 2020-10-24 ENCOUNTER — Ambulatory Visit
Admission: RE | Admit: 2020-10-24 | Discharge: 2020-10-24 | Disposition: A | Discharge status: Home / Self Care | Payer: Medicare PPO | Source: Ambulatory Visit | Attending: Physician Assistant | Admitting: Physician Assistant

## 2020-10-24 DIAGNOSIS — R1310 Dysphagia, unspecified: Secondary | ICD-10-CM | POA: Diagnosis not present

## 2020-11-04 DIAGNOSIS — N951 Menopausal and female climacteric states: Secondary | ICD-10-CM | POA: Diagnosis not present

## 2020-11-04 DIAGNOSIS — R5383 Other fatigue: Secondary | ICD-10-CM | POA: Diagnosis not present

## 2020-11-06 ENCOUNTER — Other Ambulatory Visit: Payer: Self-pay | Admitting: Neurology

## 2020-11-06 DIAGNOSIS — N898 Other specified noninflammatory disorders of vagina: Secondary | ICD-10-CM | POA: Diagnosis not present

## 2020-11-06 DIAGNOSIS — N951 Menopausal and female climacteric states: Secondary | ICD-10-CM | POA: Diagnosis not present

## 2020-11-06 DIAGNOSIS — R232 Flushing: Secondary | ICD-10-CM | POA: Diagnosis not present

## 2020-11-06 MED ORDER — TOPIRAMATE 25 MG PO TABS: ORAL_TABLET | ORAL | 3 refills | Status: DC

## 2020-12-08 ENCOUNTER — Ambulatory Visit
Admission: EM | Admit: 2020-12-08 | Discharge: 2020-12-08 | Disposition: A | Discharge status: Home / Self Care | Payer: Medicare PPO | Attending: Family Medicine | Admitting: Family Medicine

## 2020-12-08 ENCOUNTER — Encounter: Payer: Self-pay | Admitting: Family Medicine

## 2020-12-08 DIAGNOSIS — R300 Dysuria: Secondary | ICD-10-CM | POA: Insufficient documentation

## 2020-12-08 LAB — POCT URINALYSIS DIP (MANUAL ENTRY)
Bilirubin, UA: NEGATIVE
Glucose, UA: NEGATIVE mg/dL
Ketones, POC UA: NEGATIVE mg/dL
Nitrite, UA: NEGATIVE
Protein Ur, POC: NEGATIVE mg/dL
Spec Grav, UA: 1.015 (ref 1.010–1.025)
Urobilinogen, UA: 0.2 E.U./dL
pH, UA: 6 (ref 5.0–8.0)

## 2020-12-08 MED ORDER — CEPHALEXIN 500 MG PO CAPS: 500.0000 mg | ORAL_CAPSULE | Freq: Two times a day (BID) | ORAL | 0 refills | Status: AC

## 2020-12-08 NOTE — Discharge Instructions (Addendum)
Treated for urinary tract infection.  Take the medication as prescribed.  Drink plenty of fluids. Follow up as needed for continued or worsening symptoms

## 2020-12-08 NOTE — ED Triage Notes (Signed)
Pt presents to Urgent Care with c/o dysuria, frequency, and abdominal pressure x one week. Pt reports taking AZO x one week, but symptoms have worsened today.

## 2020-12-09 NOTE — ED Provider Notes (Signed)
[name]    CSN: [id number] Arrival date & time: 12/08/20  1415      History   Chief Complaint Chief Complaint  Patient presents with  . Dysuria    HPI Lisa Banks is a 69 y.o. female.   Patient is a 69 year old female who presents today with dysuria, frequency, vaginal pressure x1 week.  Was taking AZO with some relief.  Symptoms have not resolved.,  Worsening today.  No fever, chills, flank pain.     Past Medical History:  Diagnosis Date  . Cancer ([place])   . Headache   . High cholesterol   . Skin cancer   . Vision abnormalities     Patient Active Problem List   Diagnosis Date Noted  . Arthropathy of cervical facet joint 03/10/2020  . Altered sensorium 02/22/2018  . Palpitations 12/31/2017  . Atypical chest pain 12/31/2017  . Shortness of breath 12/31/2017  . Dizziness 12/31/2017  . Mixed hyperlipidemia 12/31/2017  . Insomnia 07/05/2016  . Migraines 07/05/2016  . Neck pain 07/05/2016  . Anxiety 07/05/2016    Past Surgical History:  Procedure Laterality Date  . BLADDER REPAIR W/ CESAREAN SECTION    . HYSTERECTOMY ABDOMINAL WITH SALPINGECTOMY    . TONSILLECTOMY      OB History   No obstetric history on file.      Home Medications    Prior to Admission medications   Medication Sig Start Date End Date Taking? Authorizing Provider  buPROPion (WELLBUTRIN XL) 300 MG 24 hr tablet TK 1 T PO QD IN THE MORNING 08/03/19   [provider]  Calcium Carbonate-Vitamin D (CALCIUM HIGH POTENCY/VITAMIN D) 600-200 MG-UNIT TABS Take by mouth.    [provider]  cephALEXin (KEFLEX) 500 MG capsule Take 1 capsule (500 mg total) by mouth 2 (two) times daily for 7 days. 12/08/20 12/15/20  Jilleen Essner, Tressia Miners A, NP  eletriptan (RELPAX) 40 MG tablet TAKE 1 TABLET BY MOUTH AT ONSET OF MIGRAINE. MAY REPEAT IN 2 HOURS AS NEEDED 03/10/20   Sater, Nanine Means, MD  escitalopram (LEXAPRO) 10 MG tablet Take 10 mg by mouth daily.    [provider]   meloxicam (MOBIC) 7.5 MG tablet TAKE 1 TABLET BY MOUTH EVERY DAY 09/25/20   Sater, Nanine Means, MD  ondansetron (ZOFRAN ODT) 4 MG disintegrating tablet Take 1 tablet (4 mg total) by mouth every 8 (eight) hours as needed for nausea or vomiting. 12/13/16   Harvest Dark, MD  phenazopyridine (PYRIDIUM) 95 MG tablet Take 95 mg by mouth 3 (three) times daily as needed for pain.    [provider]  tiZANidine (ZANAFLEX) 4 MG tablet Take 4 mg by mouth as needed for muscle spasms.    [provider]  topiramate (TOPAMAX) 25 MG tablet Take one tablet at night for one week, then take 2 tablets at night for one week, then take 3 tablets at night. 11/06/20   Kathrynn Ducking, MD  traMADol (ULTRAM) 50 MG tablet Take by mouth as needed.    [provider]  zolpidem (AMBIEN) 5 MG tablet Take 5 mg by mouth at bedtime as needed for sleep.    [provider]    Family History Family History  Problem Relation Age of Onset  . Hypertension Mother   . Dementia Mother   . Thyroid disease Mother   . Stroke Father     Social History Social History   Tobacco Use  . Smoking status: Never Smoker  .  Smokeless tobacco: Never Used  Vaping Use  . Vaping Use: Never used  Substance Use Topics  . Alcohol use: No    Alcohol/week: 0.0 standard drinks  . Drug use: No     Allergies   Patient has no known allergies.   Review of Systems Review of Systems   Physical Exam Triage Vital Signs ED Triage Vitals  Enc Vitals Group     BP 12/08/20 1508 133/72     Pulse Rate 12/08/20 1508 82     Resp 12/08/20 1508 16     Temp 12/08/20 1508 98.1 F (36.7 C)     Temp Source 12/08/20 1508 Oral     SpO2 12/08/20 1508 95 %     Weight 12/08/20 1531 150 lb (68 kg)     Height 12/08/20 1531 5\' 1"  (1.549 m)     Head Circumference --      Peak Flow --      Pain Score 12/08/20 1531 4     Pain Loc --      Pain Edu? --      Excl. in [place]? --    No data found.  Updated Vital  Signs BP 133/72 (BP Location: Left Arm)   Pulse 82   Temp 98.1 F (36.7 C) (Oral)   Resp 16   Ht 5\' 1"  (1.549 m)   Wt 150 lb (68 kg)   SpO2 95%   BMI 28.34 kg/m   Visual Acuity Right Eye Distance:   Left Eye Distance:   Bilateral Distance:    Right Eye Near:   Left Eye Near:    Bilateral Near:     Physical Exam Vitals and nursing note reviewed.  Constitutional:      General: She is not in acute distress.    Appearance: Normal appearance. She is not ill-appearing, toxic-appearing or diaphoretic.  HENT:     Head: Normocephalic.  Eyes:     Conjunctiva/sclera: Conjunctivae normal.  Pulmonary:     Effort: Pulmonary effort is normal.  Musculoskeletal:        General: Normal range of motion.     Cervical back: Normal range of motion.  Skin:    General: Skin is warm and dry.     Findings: No rash.  Neurological:     Mental Status: She is alert.  Psychiatric:        Mood and Affect: Mood normal.      UC Treatments / Results  Labs (all labs ordered are listed, but only abnormal results are displayed) Labs Reviewed  POCT URINALYSIS DIP (MANUAL ENTRY) - Abnormal; Notable for the following components:      Result Value   Blood, UA trace-intact (*)    Leukocytes, UA Trace (*)    All other components within normal limits  URINE CULTURE    EKG   Radiology No results found.  Procedures Procedures (including critical care time)  Medications Ordered in UC Medications - No data to display  Initial Impression / Assessment and Plan / UC Course  I have reviewed the triage vital signs and the nursing notes.  Pertinent labs & imaging results that were available during my care of the patient were reviewed by me and considered in my medical decision making (see chart for details).     Dysuria Urine with trace leuks and trace blood.  Sending for culture.  We will go ahead and treat for UTI based on symptoms and history. Treating with Keflex.  Recommend push  fluids. Follow up as needed for continued or worsening symptoms Final Clinical Impressions(s) / UC Diagnoses   Final diagnoses:  Dysuria     Discharge Instructions     Treated for urinary tract infection.  Take the medication as prescribed.  Drink plenty of fluids. Follow up as needed for continued or worsening symptoms    ED Prescriptions    Medication Sig Dispense Auth. Provider   cephALEXin (KEFLEX) 500 MG capsule Take 1 capsule (500 mg total) by mouth 2 (two) times daily for 7 days. 14 capsule Elisama Thissen A, NP     PDMP not reviewed this encounter.   Orvan July, NP 12/09/20 [phone]

## 2020-12-11 LAB — URINE CULTURE: Culture: 30000 — AB

## 2020-12-31 ENCOUNTER — Ambulatory Visit
Admission: EM | Admit: 2020-12-31 | Discharge: 2020-12-31 | Disposition: A | Discharge status: Home / Self Care | Payer: Medicare PPO | Attending: Family Medicine | Admitting: Family Medicine

## 2020-12-31 ENCOUNTER — Other Ambulatory Visit: Payer: Self-pay

## 2020-12-31 ENCOUNTER — Ambulatory Visit (INDEPENDENT_AMBULATORY_CARE_PROVIDER_SITE_OTHER): Payer: Medicare PPO

## 2020-12-31 DIAGNOSIS — R059 Cough, unspecified: Secondary | ICD-10-CM

## 2020-12-31 DIAGNOSIS — R079 Chest pain, unspecified: Secondary | ICD-10-CM

## 2020-12-31 DIAGNOSIS — R0602 Shortness of breath: Secondary | ICD-10-CM

## 2020-12-31 DIAGNOSIS — U071 COVID-19: Secondary | ICD-10-CM

## 2020-12-31 MED ORDER — PREDNISONE 10 MG PO TABS: 40.0000 mg | ORAL_TABLET | Freq: Every day | ORAL | 0 refills | Status: AC

## 2020-12-31 MED ORDER — BENZONATATE 100 MG PO CAPS: 200.0000 mg | ORAL_CAPSULE | Freq: Three times a day (TID) | ORAL | 0 refills | Status: DC

## 2020-12-31 MED ORDER — ALBUTEROL SULFATE HFA 108 (90 BASE) MCG/ACT IN AERS
1.0000 | INHALATION_SPRAY | Freq: Four times a day (QID) | RESPIRATORY_TRACT | 0 refills | Status: DC | PRN
Start: 2020-12-31 — End: 2021-08-04

## 2020-12-31 NOTE — ED Triage Notes (Signed)
Pt presents with c/o cough and sob with chest tightness and pain , tested positive 11 days ago

## 2020-12-31 NOTE — Discharge Instructions (Addendum)
Your chest x-ray did not show any concerns today.  Take the medication as prescribed. Follow up as needed for continued or worsening symptoms

## 2021-01-01 NOTE — ED Provider Notes (Addendum)
Lisa Banks    CSN: [id number] Arrival date & time: 12/31/20  1335      History   Chief Complaint Chief Complaint  Patient presents with  . Cough  . Chest Pain    HPI Lisa Banks is a 70 y.o. female.   Patient is a 70 year old female who presents today for complaints of cough, tightness in chest, shortness of breath.  Tested positive for Covid 11 days ago.  Taking Mucinex.  No fever, chills.     Past Medical History:  Diagnosis Date  . Cancer (HCC)   . Headache   . High cholesterol   . Skin cancer   . Vision abnormalities     Patient Active Problem List   Diagnosis Date Noted  . Arthropathy of cervical facet joint 03/10/2020  . Altered sensorium 02/22/2018  . Palpitations 12/31/2017  . Atypical chest pain 12/31/2017  . Shortness of breath 12/31/2017  . Dizziness 12/31/2017  . Mixed hyperlipidemia 12/31/2017  . Insomnia 07/05/2016  . Migraines 07/05/2016  . Neck pain 07/05/2016  . Anxiety 07/05/2016    Past Surgical History:  Procedure Laterality Date  . BLADDER REPAIR W/ CESAREAN SECTION    . HYSTERECTOMY ABDOMINAL WITH SALPINGECTOMY    . TONSILLECTOMY      OB History   No obstetric history on file.      Home Medications    Prior to Admission medications   Medication Sig Start Date End Date Taking? Authorizing Provider  albuterol (VENTOLIN HFA) 108 (90 Base) MCG/ACT inhaler Inhale 1-2 puffs into the lungs every 6 (six) hours as needed for wheezing or shortness of breath. 12/31/20  Yes Bast, Traci A, NP  predniSONE (DELTASONE) 10 MG tablet Take 4 tablets (40 mg total) by mouth daily for 5 days. 12/31/20 01/05/21 Yes Bast, Traci A, NP  benzonatate (TESSALON) 100 MG capsule Take 2 capsules (200 mg total) by mouth every 8 (eight) hours. 12/31/20   Dahlia Byes A, NP  buPROPion (WELLBUTRIN XL) 300 MG 24 hr tablet TK 1 T PO QD IN THE MORNING 08/03/19   [provider]  Calcium Carbonate-Vitamin D (CALCIUM HIGH POTENCY/VITAMIN D) 600-200  MG-UNIT TABS Take by mouth.    [provider]  eletriptan (RELPAX) 40 MG tablet TAKE 1 TABLET BY MOUTH AT ONSET OF MIGRAINE. MAY REPEAT IN 2 HOURS AS NEEDED 03/10/20   Sater, Pearletha Furl, MD  escitalopram (LEXAPRO) 10 MG tablet Take 10 mg by mouth daily.    [provider]  meloxicam (MOBIC) 7.5 MG tablet TAKE 1 TABLET BY MOUTH EVERY DAY 09/25/20   Sater, Pearletha Furl, MD  ondansetron (ZOFRAN ODT) 4 MG disintegrating tablet Take 1 tablet (4 mg total) by mouth every 8 (eight) hours as needed for nausea or vomiting. 12/13/16   Minna Antis, MD  phenazopyridine (PYRIDIUM) 95 MG tablet Take 95 mg by mouth 3 (three) times daily as needed for pain.    [provider]  tiZANidine (ZANAFLEX) 4 MG tablet Take 4 mg by mouth as needed for muscle spasms.    [provider]  topiramate (TOPAMAX) 25 MG tablet Take one tablet at night for one week, then take 2 tablets at night for one week, then take 3 tablets at night. 11/06/20   York Spaniel, MD  traMADol (ULTRAM) 50 MG tablet Take by mouth as needed.    [provider]  zolpidem (AMBIEN) 5 MG tablet Take 5 mg by mouth at bedtime as needed for sleep.  [provider]    Family History Family History  Problem Relation Age of Onset  . Hypertension Mother   . Dementia Mother   . Thyroid disease Mother   . Stroke Father     Social History Social History   Tobacco Use  . Smoking status: Never Smoker  . Smokeless tobacco: Never Used  Vaping Use  . Vaping Use: Never used  Substance Use Topics  . Alcohol use: No    Alcohol/week: 0.0 standard drinks  . Drug use: No     Allergies   Patient has no known allergies.   Review of Systems Review of Systems   Physical Exam Triage Vital Signs ED Triage Vitals  Enc Vitals Group     BP 12/31/20 1636 (!) 148/73     Pulse Rate 12/31/20 1636 80     Resp 12/31/20 1636 18     Temp 12/31/20 1636 98.2 F (36.8 C)     Temp src --      SpO2  12/31/20 1636 97 %     Weight --      Height --      Head Circumference --      Peak Flow --      Pain Score 12/31/20 1635 3     Pain Loc --      Pain Edu? --      Excl. in [place]? --    No data found.  Updated Vital Signs BP (!) 148/73   Pulse 80   Temp 98.2 F (36.8 C)   Resp 18   SpO2 97%   Visual Acuity Right Eye Distance:   Left Eye Distance:   Bilateral Distance:    Right Eye Near:   Left Eye Near:    Bilateral Near:     Physical Exam Vitals and nursing note reviewed.  Constitutional:      General: She is not in acute distress.    Appearance: Normal appearance. She is not ill-appearing, toxic-appearing or diaphoretic.  HENT:     Head: Normocephalic.     Right Ear: Tympanic membrane and ear canal normal.     Left Ear: Tympanic membrane and ear canal normal.     Nose: Nose normal.     Mouth/Throat:     Pharynx: Oropharynx is clear.  Eyes:     Conjunctiva/sclera: Conjunctivae normal.  Cardiovascular:     Rate and Rhythm: Normal rate and regular rhythm.  Pulmonary:     Effort: Pulmonary effort is normal.     Comments: Decreased lung sounds in bases  Musculoskeletal:        General: Normal range of motion.     Cervical back: Normal range of motion.  Skin:    General: Skin is warm and dry.     Findings: No rash.  Neurological:     Mental Status: She is alert.  Psychiatric:        Mood and Affect: Mood normal.      UC Treatments / Results  Labs (all labs ordered are listed, but only abnormal results are displayed) Labs Reviewed - No data to display  EKG   Radiology DG Chest 2 View  Result Date: 12/31/2020 CLINICAL DATA:  Shortness of breath status post COVID. Left-sided chest pain for 9 days. EXAM: CHEST - 2 VIEW COMPARISON:  12/13/2016 FINDINGS: The heart size and mediastinal contours are within normal limits. Both lungs are clear. The visualized skeletal structures are unremarkable. IMPRESSION: No acute cardiopulmonary disease. Electronically  Signed   By: Miachel Roux M.D.   On: 12/31/2020 16:56    Procedures Procedures (including critical care time)  Medications Ordered in UC Medications - No data to display  Initial Impression / Assessment and Plan / UC Course  I have reviewed the triage vital signs and the nursing notes.  Pertinent labs & imaging results that were available during my care of the patient were reviewed by me and considered in my medical decision making (see chart for details).     Cough: COVID-19 No concerns on exam.  Oxygen saturations 97%. Chest x ray non concerning.  Most likely lingering cough from virus and lung inflammation Treating with prednisone as prescribed and albuterol as needed Tessalon Perles for cough Final Clinical Impressions(s) / UC Diagnoses   Final diagnoses:  Cough  COVID-19     Discharge Instructions     Your chest x-ray did not show any concerns today.  Take the medication as prescribed. Follow up as needed for continued or worsening symptoms     ED Prescriptions    Medication Sig Dispense Auth. Provider   predniSONE (DELTASONE) 10 MG tablet Take 4 tablets (40 mg total) by mouth daily for 5 days. 20 tablet Bast, Traci A, NP   albuterol (VENTOLIN HFA) 108 (90 Base) MCG/ACT inhaler Inhale 1-2 puffs into the lungs every 6 (six) hours as needed for wheezing or shortness of breath. 1 each Bast, Traci A, NP   benzonatate (TESSALON) 100 MG capsule  (Status: Discontinued) Take 2 capsules (200 mg total) by mouth every 8 (eight) hours. 135 capsule Bast, Traci A, NP   benzonatate (TESSALON) 100 MG capsule Take 2 capsules (200 mg total) by mouth every 8 (eight) hours. 135 capsule Bast, Traci A, NP     PDMP not reviewed this encounter.   Orvan July, NP 01/01/21 0801    Orvan July, NP 01/01/21 [phone]

## 2021-01-14 DIAGNOSIS — R5383 Other fatigue: Secondary | ICD-10-CM | POA: Diagnosis not present

## 2021-01-14 DIAGNOSIS — N951 Menopausal and female climacteric states: Secondary | ICD-10-CM | POA: Diagnosis not present

## 2021-01-19 DIAGNOSIS — N951 Menopausal and female climacteric states: Secondary | ICD-10-CM | POA: Diagnosis not present

## 2021-01-19 DIAGNOSIS — M2550 Pain in unspecified joint: Secondary | ICD-10-CM | POA: Diagnosis not present

## 2021-01-19 DIAGNOSIS — N898 Other specified noninflammatory disorders of vagina: Secondary | ICD-10-CM | POA: Diagnosis not present

## 2021-01-19 DIAGNOSIS — Z6828 Body mass index (BMI) 28.0-28.9, adult: Secondary | ICD-10-CM | POA: Diagnosis not present

## 2021-01-19 DIAGNOSIS — R232 Flushing: Secondary | ICD-10-CM | POA: Diagnosis not present

## 2021-03-11 ENCOUNTER — Ambulatory Visit: Payer: Medicare PPO | Admitting: Neurology

## 2021-03-14 ENCOUNTER — Other Ambulatory Visit: Payer: Self-pay | Admitting: Neurology

## 2021-03-24 DIAGNOSIS — Z1231 Encounter for screening mammogram for malignant neoplasm of breast: Secondary | ICD-10-CM | POA: Diagnosis not present

## 2021-03-31 DIAGNOSIS — M545 Low back pain, unspecified: Secondary | ICD-10-CM | POA: Diagnosis not present

## 2021-03-31 DIAGNOSIS — M791 Myalgia, unspecified site: Secondary | ICD-10-CM | POA: Diagnosis not present

## 2021-03-31 DIAGNOSIS — M47896 Other spondylosis, lumbar region: Secondary | ICD-10-CM | POA: Diagnosis not present

## 2021-03-31 DIAGNOSIS — Z6827 Body mass index (BMI) 27.0-27.9, adult: Secondary | ICD-10-CM | POA: Diagnosis not present

## 2021-04-01 DIAGNOSIS — N951 Menopausal and female climacteric states: Secondary | ICD-10-CM | POA: Diagnosis not present

## 2021-04-01 DIAGNOSIS — R5383 Other fatigue: Secondary | ICD-10-CM | POA: Diagnosis not present

## 2021-04-03 DIAGNOSIS — N951 Menopausal and female climacteric states: Secondary | ICD-10-CM | POA: Diagnosis not present

## 2021-04-03 DIAGNOSIS — R232 Flushing: Secondary | ICD-10-CM | POA: Diagnosis not present

## 2021-04-03 DIAGNOSIS — N898 Other specified noninflammatory disorders of vagina: Secondary | ICD-10-CM | POA: Diagnosis not present

## 2021-04-15 ENCOUNTER — Ambulatory Visit: Payer: Medicare PPO | Admitting: Neurology

## 2021-04-24 DIAGNOSIS — M5030 Other cervical disc degeneration, unspecified cervical region: Secondary | ICD-10-CM | POA: Diagnosis not present

## 2021-04-24 DIAGNOSIS — G8929 Other chronic pain: Secondary | ICD-10-CM | POA: Diagnosis not present

## 2021-04-24 DIAGNOSIS — M47816 Spondylosis without myelopathy or radiculopathy, lumbar region: Secondary | ICD-10-CM | POA: Diagnosis not present

## 2021-04-24 DIAGNOSIS — M5442 Lumbago with sciatica, left side: Secondary | ICD-10-CM | POA: Diagnosis not present

## 2021-04-24 DIAGNOSIS — M5412 Radiculopathy, cervical region: Secondary | ICD-10-CM | POA: Diagnosis not present

## 2021-04-28 DIAGNOSIS — M545 Low back pain, unspecified: Secondary | ICD-10-CM | POA: Diagnosis not present

## 2021-05-18 DIAGNOSIS — M47816 Spondylosis without myelopathy or radiculopathy, lumbar region: Secondary | ICD-10-CM | POA: Diagnosis not present

## 2021-05-18 DIAGNOSIS — M5412 Radiculopathy, cervical region: Secondary | ICD-10-CM | POA: Diagnosis not present

## 2021-05-18 DIAGNOSIS — M5030 Other cervical disc degeneration, unspecified cervical region: Secondary | ICD-10-CM | POA: Diagnosis not present

## 2021-06-08 DIAGNOSIS — N951 Menopausal and female climacteric states: Secondary | ICD-10-CM | POA: Diagnosis not present

## 2021-06-08 DIAGNOSIS — E782 Mixed hyperlipidemia: Secondary | ICD-10-CM | POA: Diagnosis not present

## 2021-06-08 DIAGNOSIS — F419 Anxiety disorder, unspecified: Secondary | ICD-10-CM | POA: Diagnosis not present

## 2021-06-08 DIAGNOSIS — F325 Major depressive disorder, single episode, in full remission: Secondary | ICD-10-CM | POA: Diagnosis not present

## 2021-06-08 DIAGNOSIS — G43009 Migraine without aura, not intractable, without status migrainosus: Secondary | ICD-10-CM | POA: Diagnosis not present

## 2021-06-10 DIAGNOSIS — R232 Flushing: Secondary | ICD-10-CM | POA: Diagnosis not present

## 2021-06-10 DIAGNOSIS — Z6827 Body mass index (BMI) 27.0-27.9, adult: Secondary | ICD-10-CM | POA: Diagnosis not present

## 2021-06-10 DIAGNOSIS — N898 Other specified noninflammatory disorders of vagina: Secondary | ICD-10-CM | POA: Diagnosis not present

## 2021-06-10 DIAGNOSIS — N951 Menopausal and female climacteric states: Secondary | ICD-10-CM | POA: Diagnosis not present

## 2021-06-24 ENCOUNTER — Ambulatory Visit: Payer: Medicare PPO | Admitting: Neurology

## 2021-08-04 ENCOUNTER — Encounter: Payer: Self-pay | Admitting: Neurology

## 2021-08-04 ENCOUNTER — Other Ambulatory Visit: Payer: Self-pay

## 2021-08-04 ENCOUNTER — Ambulatory Visit: Payer: Medicare PPO | Admitting: Neurology

## 2021-08-04 VITALS — BP 114/71 | HR 68 | Ht 61.0 in | Wt 150.5 lb

## 2021-08-04 DIAGNOSIS — M47812 Spondylosis without myelopathy or radiculopathy, cervical region: Secondary | ICD-10-CM | POA: Diagnosis not present

## 2021-08-04 DIAGNOSIS — M542 Cervicalgia: Secondary | ICD-10-CM

## 2021-08-04 DIAGNOSIS — G43909 Migraine, unspecified, not intractable, without status migrainosus: Secondary | ICD-10-CM

## 2021-08-04 DIAGNOSIS — G4700 Insomnia, unspecified: Secondary | ICD-10-CM | POA: Diagnosis not present

## 2021-08-04 MED ORDER — ELETRIPTAN HYDROBROMIDE 40 MG PO TABS
ORAL_TABLET | ORAL | 5 refills | Status: DC
Start: 2021-08-04 — End: 2022-05-25

## 2021-08-04 NOTE — Progress Notes (Signed)
[facility]  PATIENT: Lisa Banks DOB: 1951-11-06  REFERRING DOCTOR OR PCP:  Lisa Anis, MD [facility]) SOURCE: patient, records from Dr. Baltazar Najjar  _________________________________   HISTORICAL  CHIEF COMPLAINT:  Chief Complaint  Patient presents with   Follow-up    Rm 1, alone. Here for yearly migraine f/u, pt reports doing well. Migraine are 1x a week.     HISTORY OF PRESENT ILLNESS:  Lisa Banks is a 70 yo woman with headaches and insomnia.     Update 08/04/2021: Her migraines are doing better.  She now has themonce or twice a month and Relpax knocks them out practically every time.      She stopped nortriptyline and topiramate as she is doing better.     She has intermittent neck pain.   If mild, she takes Tyloenol +/- tizanidine.  If severe she takes tramadol (once a month or so).     She has C4C5 facet hypertrophy but RFA of medial branch nerves did not help much.    She saw surgery but is reluctant to proceed with surgery.     She has had more insomnia and takes Ambien if a lot is going through her mind.  She takes 2/week most weeks.     Mood is doing well on Lexapro and Wellbutrin.    She had her Covid vaccination    She had Covid the first week of January 2022.    She wasn't able to get Pxlovid and needed prednisone for some respiratory symptoms.   She recovered well.    Headache history: She has had migraine headaches since she was in her early teen years about 50 years ago. Initially, her migraines were intermittent and mostly associated with her period.   Then, in her 37s the headaches would also be triggered by stress and began to become more frequent. Sometimes she would have a week or 2 of headache in a row and then be better for a while. By her late 77s, the headaches were near daily. Typically, she would have a dull pain in the back of her head that would intensify on a near daily basis to include the entire head with pounding  pain.  She would have nausea and sometimes vomiting. Additionally she had photophobia and phonophobia. Moving would make the headache worse.  Resting in a quiet dark room would help. She was placed on Relpax at some point and that would help the headaches but the headaches often came back the next day. Several years ago, when she was seen Dr. Ermalene Postin, she took Botox for a couple years. That helped her headaches. She stopped Botox around 2014. She then started to see Dr. Baltazar Najjar and she placed her on Trokendi with Relpax as a rescue medication. That worked rather well for her until recently.    In early May, she had a tic bite. A couple days later she began to have a headache and the headache has been near daily since then. The headache is a little bit different than her typical migraine headaches in that it is more dull in the head and over the eyes without nausea and with less photophobia and phonophobia. Relpax helps but the headache will come back the next day.   REVIEW OF SYSTEMS: Constitutional: No fevers, chills, sweats, or change in appetite.  Reports insomnia Eyes: No visual changes, double vision, eye pain Ear, nose and throat: No hearing loss, ear pain, nasal congestion, sore throat Cardiovascular: No chest  pain, palpitations Respiratory:  No shortness of breath at rest or with exertion.   No wheezes GastrointestinaI: No nausea, vomiting, diarrhea, abdominal pain, fecal incontinence Genitourinary:  No dysuria, urinary retention or frequency.  No nocturia. Musculoskeletal:  No neck pain, back pain Integumentary: No rash, pruritus, skin lesions Neurological: as above Psychiatric: Has anxiety but No depression Endocrine: No palpitations, diaphoresis, change in appetite, change in weigh or increased thirst Hematologic/Lymphatic:  No anemia, purpura, petechiae. Allergic/Immunologic: No itchy/runny eyes, nasal congestion, recent allergic reactions, rashes  ALLERGIES: No Known Allergies  HOME  MEDICATIONS:  Current Outpatient Medications:    buPROPion (WELLBUTRIN XL) 300 MG 24 hr tablet, TK 1 T PO QD IN THE MORNING, Disp: , Rfl:    Calcium Carbonate-Vitamin D 600-200 MG-UNIT TABS, Take by mouth., Disp: , Rfl:    escitalopram (LEXAPRO) 10 MG tablet, Take 10 mg by mouth daily., Disp: , Rfl:    tiZANidine (ZANAFLEX) 4 MG tablet, Take 4 mg by mouth as needed for muscle spasms., Disp: , Rfl:    traMADol (ULTRAM) 50 MG tablet, Take by mouth as needed., Disp: , Rfl:    zolpidem (AMBIEN) 5 MG tablet, Take 5 mg by mouth at bedtime as needed for sleep., Disp: , Rfl:    eletriptan (RELPAX) 40 MG tablet, Take 1 tab at onset of migraine.  May repeat in 2 hrs, if needed.  Max dose: 2 tabs/day. This is a 30 day prescription., Disp: 12 tablet, Rfl: 5  PAST MEDICAL HISTORY: Past Medical History:  Diagnosis Date   Cancer ([place])    Headache    High cholesterol    Skin cancer    Vision abnormalities     PAST SURGICAL HISTORY: Past Surgical History:  Procedure Laterality Date   BLADDER REPAIR W/ CESAREAN SECTION     HYSTERECTOMY ABDOMINAL WITH SALPINGECTOMY     TONSILLECTOMY      FAMILY HISTORY: Family History  Problem Relation Age of Onset   Hypertension Mother    Dementia Mother    Thyroid disease Mother    Stroke Father     SOCIAL HISTORY:  Social History   Socioeconomic History   Marital status: Married    Spouse name: Not on file   Number of children: Not on file   Years of education: Not on file   Highest education level: Not on file  Occupational History   Not on file  Tobacco Use   Smoking status: Never   Smokeless tobacco: Never  Vaping Use   Vaping Use: Never used  Substance and Sexual Activity   Alcohol use: No    Alcohol/week: 0.0 standard drinks   Drug use: No   Sexual activity: Not on file  Other Topics Concern   Not on file  Social History Narrative   Not on file   Social Determinants of Health   Financial Resource Strain: Not on file  Food  Insecurity: Not on file  Transportation Needs: Not on file  Physical Activity: Not on file  Stress: Not on file  Social Connections: Not on file  Intimate Partner Violence: Not on file     PHYSICAL EXAM  Vitals:   08/04/21 0815  BP: 114/71  Pulse: 68  Weight: 150 lb 8 oz (68.3 kg)  Height: '5\' 1"'$  (1.549 m)    Body mass index is 28.44 kg/m.   General: The patient is well-developed and well-nourished and in no acute distress  Neck: No neck tenderness but range of motion in the neck  is mildly reduced, especially on right lateral bend  Neurologic Exam  Mental status: The patient is alert and oriented x 3 at the time of the examination. The patient has apparent normal recent and remote memory, with an apparently normal attention span and concentration ability.   Speech is normal.  Cranial nerves: Extraocular movements are full. Facial strength and sensation is normal. Trapezius strength is normal.  Hearing appears normal.  Motor:  Muscle bulk is normal.   Tone is normal. Strength is  5 / 5 in all 4 extremities.   Sensory: Sensory testing is intact to touch and vibration sensation in arms .  Coordination: Cerebellar testing reveals good finger-nose-finger  bilaterally.  Gait and station: Station is normal.   Gait and tandem gait are normal.  Romberg is negative.  Reflexes: Deep tendon reflexes are symmetric and normal bilaterally.       ASSESSMENT AND PLAN  Migraine without status migrainosus, not intractable, unspecified migraine type  Neck pain  Arthropathy of cervical facet joint  Insomnia, unspecified type   1.   She is doing well with reduced headaches.  She will take Relpax when 1 occurs.  If that fails she can take tramadol. 2.  Stay active and exercise as tolerated 3   neck pain is doing well.  She would prefer not to have surgery.  She will continue to take tizanidine and tramadol as needed..   4.   Insomnia (sleep onset) is still occurring but she has  done well with intermittent use of Ambien and will continue.  Mood has done well on current treatment  5.   return in 6 months or sooner if there are new or worsening neurologic symptoms.   Lisa Banks A. Felecia Shelling, MD, PhD [id number], [id number] AM Certified in Neurology, Clinical Neurophysiology, Sleep Medicine, Pain Medicine and Neuroimaging  [facility] [address], [place], [place] [address] [phone]

## 2021-08-11 DIAGNOSIS — E782 Mixed hyperlipidemia: Secondary | ICD-10-CM | POA: Diagnosis not present

## 2021-08-21 DIAGNOSIS — G8929 Other chronic pain: Secondary | ICD-10-CM | POA: Diagnosis not present

## 2021-08-21 DIAGNOSIS — M5030 Other cervical disc degeneration, unspecified cervical region: Secondary | ICD-10-CM | POA: Diagnosis not present

## 2021-08-21 DIAGNOSIS — M5412 Radiculopathy, cervical region: Secondary | ICD-10-CM | POA: Diagnosis not present

## 2021-08-21 DIAGNOSIS — M5442 Lumbago with sciatica, left side: Secondary | ICD-10-CM | POA: Diagnosis not present

## 2021-08-24 DIAGNOSIS — R5383 Other fatigue: Secondary | ICD-10-CM | POA: Diagnosis not present

## 2021-08-24 DIAGNOSIS — N951 Menopausal and female climacteric states: Secondary | ICD-10-CM | POA: Diagnosis not present

## 2021-08-26 DIAGNOSIS — N951 Menopausal and female climacteric states: Secondary | ICD-10-CM | POA: Diagnosis not present

## 2021-08-26 DIAGNOSIS — R232 Flushing: Secondary | ICD-10-CM | POA: Diagnosis not present

## 2021-08-26 DIAGNOSIS — N898 Other specified noninflammatory disorders of vagina: Secondary | ICD-10-CM | POA: Diagnosis not present

## 2021-08-26 DIAGNOSIS — Z6828 Body mass index (BMI) 28.0-28.9, adult: Secondary | ICD-10-CM | POA: Diagnosis not present

## 2021-08-28 DIAGNOSIS — M4802 Spinal stenosis, cervical region: Secondary | ICD-10-CM | POA: Diagnosis not present

## 2021-08-28 DIAGNOSIS — M5412 Radiculopathy, cervical region: Secondary | ICD-10-CM | POA: Diagnosis not present

## 2021-09-21 DIAGNOSIS — M5030 Other cervical disc degeneration, unspecified cervical region: Secondary | ICD-10-CM | POA: Diagnosis not present

## 2021-09-21 DIAGNOSIS — M5442 Lumbago with sciatica, left side: Secondary | ICD-10-CM | POA: Diagnosis not present

## 2021-09-21 DIAGNOSIS — G8929 Other chronic pain: Secondary | ICD-10-CM | POA: Diagnosis not present

## 2021-09-21 DIAGNOSIS — M5412 Radiculopathy, cervical region: Secondary | ICD-10-CM | POA: Diagnosis not present

## 2021-09-29 DIAGNOSIS — E559 Vitamin D deficiency, unspecified: Secondary | ICD-10-CM | POA: Diagnosis not present

## 2021-09-29 DIAGNOSIS — Z1389 Encounter for screening for other disorder: Secondary | ICD-10-CM | POA: Diagnosis not present

## 2021-09-29 DIAGNOSIS — E782 Mixed hyperlipidemia: Secondary | ICD-10-CM | POA: Diagnosis not present

## 2021-09-29 DIAGNOSIS — Z23 Encounter for immunization: Secondary | ICD-10-CM | POA: Diagnosis not present

## 2021-09-29 DIAGNOSIS — Z789 Other specified health status: Secondary | ICD-10-CM | POA: Diagnosis not present

## 2021-09-29 DIAGNOSIS — Z0000 Encounter for general adult medical examination without abnormal findings: Secondary | ICD-10-CM | POA: Diagnosis not present

## 2021-09-29 DIAGNOSIS — F325 Major depressive disorder, single episode, in full remission: Secondary | ICD-10-CM | POA: Diagnosis not present

## 2021-09-29 DIAGNOSIS — G4700 Insomnia, unspecified: Secondary | ICD-10-CM | POA: Diagnosis not present

## 2021-10-12 ENCOUNTER — Other Ambulatory Visit: Payer: Self-pay | Admitting: Neurology

## 2021-10-19 DIAGNOSIS — Z23 Encounter for immunization: Secondary | ICD-10-CM | POA: Diagnosis not present

## 2021-10-19 DIAGNOSIS — D485 Neoplasm of uncertain behavior of skin: Secondary | ICD-10-CM | POA: Diagnosis not present

## 2021-10-19 DIAGNOSIS — Z85828 Personal history of other malignant neoplasm of skin: Secondary | ICD-10-CM | POA: Diagnosis not present

## 2021-10-19 DIAGNOSIS — C44712 Basal cell carcinoma of skin of right lower limb, including hip: Secondary | ICD-10-CM | POA: Diagnosis not present

## 2021-10-22 DIAGNOSIS — N393 Stress incontinence (female) (male): Secondary | ICD-10-CM | POA: Diagnosis not present

## 2021-10-22 DIAGNOSIS — R32 Unspecified urinary incontinence: Secondary | ICD-10-CM | POA: Diagnosis not present

## 2021-10-22 DIAGNOSIS — N8189 Other female genital prolapse: Secondary | ICD-10-CM | POA: Diagnosis not present

## 2021-11-10 ENCOUNTER — Other Ambulatory Visit: Payer: Self-pay | Admitting: Neurology

## 2021-11-10 DIAGNOSIS — N951 Menopausal and female climacteric states: Secondary | ICD-10-CM | POA: Diagnosis not present

## 2021-11-10 DIAGNOSIS — R5383 Other fatigue: Secondary | ICD-10-CM | POA: Diagnosis not present

## 2021-11-12 DIAGNOSIS — C44519 Basal cell carcinoma of skin of other part of trunk: Secondary | ICD-10-CM | POA: Diagnosis not present

## 2021-11-12 DIAGNOSIS — Z23 Encounter for immunization: Secondary | ICD-10-CM | POA: Diagnosis not present

## 2021-11-12 DIAGNOSIS — Z6828 Body mass index (BMI) 28.0-28.9, adult: Secondary | ICD-10-CM | POA: Diagnosis not present

## 2021-11-12 DIAGNOSIS — R5383 Other fatigue: Secondary | ICD-10-CM | POA: Diagnosis not present

## 2021-11-12 DIAGNOSIS — N951 Menopausal and female climacteric states: Secondary | ICD-10-CM | POA: Diagnosis not present

## 2021-11-12 DIAGNOSIS — M2550 Pain in unspecified joint: Secondary | ICD-10-CM | POA: Diagnosis not present

## 2021-11-25 DIAGNOSIS — N393 Stress incontinence (female) (male): Secondary | ICD-10-CM | POA: Diagnosis not present

## 2021-11-30 DIAGNOSIS — K219 Gastro-esophageal reflux disease without esophagitis: Secondary | ICD-10-CM | POA: Diagnosis not present

## 2021-11-30 DIAGNOSIS — N393 Stress incontinence (female) (male): Secondary | ICD-10-CM | POA: Diagnosis not present

## 2021-11-30 DIAGNOSIS — R32 Unspecified urinary incontinence: Secondary | ICD-10-CM | POA: Diagnosis not present

## 2022-01-13 ENCOUNTER — Telehealth: Payer: Medicare PPO | Admitting: Family

## 2022-01-13 DIAGNOSIS — U071 COVID-19: Secondary | ICD-10-CM | POA: Diagnosis not present

## 2022-01-13 MED ORDER — MOLNUPIRAVIR EUA 200MG CAPSULE: 4.0000 | ORAL_CAPSULE | Freq: Two times a day (BID) | ORAL | 0 refills | Status: AC

## 2022-01-13 NOTE — Progress Notes (Signed)
Virtual Visit Consent   [name], you are scheduled for a virtual visit with a [facility] provider today.     Just as with appointments in the office, your consent must be obtained to participate.  Your consent will be active for this visit and any virtual visit you may have with one of our providers in the next 365 days.     If you have a MyChart account, a copy of this consent can be sent to you electronically.  All virtual visits are billed to your insurance company just like a traditional visit in the office.    As this is a virtual visit, video technology does not allow for your provider to perform a traditional examination.  This may limit your provider's ability to fully assess your condition.  If your provider identifies any concerns that need to be evaluated in person or the need to arrange testing (such as labs, EKG, etc.), we will make arrangements to do so.     Although advances in technology are sophisticated, we cannot ensure that it will always work on either your end or our end.  If the connection with a video visit is poor, the visit may have to be switched to a telephone visit.  With either a video or telephone visit, we are not always able to ensure that we have a secure connection.     I need to obtain your verbal consent now.   Are you willing to proceed with your visit today?    Lisa Banks has provided verbal consent on 01/13/2022 for a virtual visit (video or telephone).   [name], FNP   Date: 01/13/2022 12:10 PM   Virtual Visit via Video Note   I, [name], connected with  Lisa Banks  ([id number], [birth year]) on 01/13/22 at 12:30 PM EST by a video-enabled telemedicine application and verified that I am speaking with the correct person using two identifiers.  Location: Patient: Virtual Visit Location Patient: Home Provider: Virtual Visit Location Provider: Home Office   I discussed the limitations of evaluation and management by telemedicine and  the availability of in person appointments. The patient expressed understanding and agreed to proceed.    History of Present Illness: Lisa Banks is a 71 y.o. who identifies as a female who was assigned female at birth, and is being seen today for COVID. She reports her symptoms started this morning. Her husband was diagnosed last week.   HPI: Cough This is a new problem. The current episode started in the past 7 days. The problem has been gradually worsening. The problem occurs every few minutes. The cough is Non-productive. Associated symptoms include ear congestion, ear pain, a fever, headaches, myalgias, nasal congestion and postnasal drip. Pertinent negatives include no chills, shortness of breath or wheezing. She has tried rest and OTC cough suppressant for the symptoms. The treatment provided mild relief.   Problems:  Patient Active Problem List   Diagnosis Date Noted   Arthropathy of cervical facet joint 03/10/2020   Altered sensorium 02/22/2018   Palpitations 12/31/2017   Atypical chest pain 12/31/2017   Shortness of breath 12/31/2017   Dizziness 12/31/2017   Mixed hyperlipidemia 12/31/2017   Insomnia 07/05/2016   Migraines 07/05/2016   Neck pain 07/05/2016   Anxiety 07/05/2016    Allergies: No Known Allergies Medications:  Current Outpatient Medications:    molnupiravir EUA (LAGEVRIO) 200 mg CAPS capsule, Take 4 capsules (800 mg total) by mouth 2 (two) times  daily for 5 days., Disp: 40 capsule, Rfl: 0   buPROPion (WELLBUTRIN XL) 300 MG 24 hr tablet, TK 1 T PO QD IN THE MORNING, Disp: , Rfl:    Calcium Carbonate-Vitamin D 600-200 MG-UNIT TABS, Take by mouth., Disp: , Rfl:    eletriptan (RELPAX) 40 MG tablet, Take 1 tab at onset of migraine.  May repeat in 2 hrs, if needed.  Max dose: 2 tabs/day. This is a 30 day prescription., Disp: 12 tablet, Rfl: 5   escitalopram (LEXAPRO) 10 MG tablet, Take 10 mg by mouth daily., Disp: , Rfl:    tiZANidine (ZANAFLEX) 4 MG tablet, Take 4 mg  by mouth as needed for muscle spasms., Disp: , Rfl:    traMADol (ULTRAM) 50 MG tablet, Take by mouth as needed., Disp: , Rfl:    zolpidem (AMBIEN) 5 MG tablet, Take 5 mg by mouth at bedtime as needed for sleep., Disp: , Rfl:   Observations/Objective: Patient is well-developed, well-nourished in no acute distress.  Resting comfortably  at home.  Head is normocephalic, atraumatic.  No labored breathing.  Speech is clear and coherent with logical content.  Patient is alert and oriented at baseline.    Assessment and Plan: 1. COVID-19 - molnupiravir EUA (LAGEVRIO) 200 mg CAPS capsule; Take 4 capsules (800 mg total) by mouth 2 (two) times daily for 5 days.  Dispense: 40 capsule; Refill: 0  COVID positive, rest, force fluids, tylenol as needed, Quarantine for at least 5 days and you are fever free, then must wear a mask out in public from day 8-18, report any worsening symptoms such as increased shortness of breath, swelling, or continued high fevers. Possible adverse effects discussed with antivirals.    Follow Up Instructions: I discussed the assessment and treatment plan with the patient. The patient was provided an opportunity to ask questions and all were answered. The patient agreed with the plan and demonstrated an understanding of the instructions.  A copy of instructions were sent to the patient via MyChart unless otherwise noted below.     The patient was advised to call back or seek an in-person evaluation if the symptoms worsen or if the condition fails to improve as anticipated.  Time:  I spent 6 minutes with the patient via telehealth technology discussing the above problems/concerns.    [name], FNP

## 2022-01-14 DIAGNOSIS — R399 Unspecified symptoms and signs involving the genitourinary system: Secondary | ICD-10-CM | POA: Diagnosis not present

## 2022-01-21 DIAGNOSIS — N951 Menopausal and female climacteric states: Secondary | ICD-10-CM | POA: Diagnosis not present

## 2022-01-21 DIAGNOSIS — E038 Other specified hypothyroidism: Secondary | ICD-10-CM | POA: Diagnosis not present

## 2022-01-25 DIAGNOSIS — Z6828 Body mass index (BMI) 28.0-28.9, adult: Secondary | ICD-10-CM | POA: Diagnosis not present

## 2022-01-25 DIAGNOSIS — E038 Other specified hypothyroidism: Secondary | ICD-10-CM | POA: Diagnosis not present

## 2022-01-25 DIAGNOSIS — Z72820 Sleep deprivation: Secondary | ICD-10-CM | POA: Diagnosis not present

## 2022-01-25 DIAGNOSIS — N951 Menopausal and female climacteric states: Secondary | ICD-10-CM | POA: Diagnosis not present

## 2022-01-25 DIAGNOSIS — N898 Other specified noninflammatory disorders of vagina: Secondary | ICD-10-CM | POA: Diagnosis not present

## 2022-02-07 DIAGNOSIS — R300 Dysuria: Secondary | ICD-10-CM | POA: Diagnosis not present

## 2022-02-07 DIAGNOSIS — N390 Urinary tract infection, site not specified: Secondary | ICD-10-CM | POA: Diagnosis not present

## 2022-03-15 DIAGNOSIS — H43811 Vitreous degeneration, right eye: Secondary | ICD-10-CM | POA: Diagnosis not present

## 2022-03-15 DIAGNOSIS — H11442 Conjunctival cysts, left eye: Secondary | ICD-10-CM | POA: Diagnosis not present

## 2022-04-12 DIAGNOSIS — E038 Other specified hypothyroidism: Secondary | ICD-10-CM | POA: Diagnosis not present

## 2022-04-12 DIAGNOSIS — N951 Menopausal and female climacteric states: Secondary | ICD-10-CM | POA: Diagnosis not present

## 2022-04-14 DIAGNOSIS — Z6828 Body mass index (BMI) 28.0-28.9, adult: Secondary | ICD-10-CM | POA: Diagnosis not present

## 2022-04-14 DIAGNOSIS — R232 Flushing: Secondary | ICD-10-CM | POA: Diagnosis not present

## 2022-04-14 DIAGNOSIS — N951 Menopausal and female climacteric states: Secondary | ICD-10-CM | POA: Diagnosis not present

## 2022-04-14 DIAGNOSIS — N898 Other specified noninflammatory disorders of vagina: Secondary | ICD-10-CM | POA: Diagnosis not present

## 2022-04-16 DIAGNOSIS — H43811 Vitreous degeneration, right eye: Secondary | ICD-10-CM | POA: Diagnosis not present

## 2022-05-03 DIAGNOSIS — Z1231 Encounter for screening mammogram for malignant neoplasm of breast: Secondary | ICD-10-CM | POA: Diagnosis not present

## 2022-05-03 DIAGNOSIS — Z780 Asymptomatic menopausal state: Secondary | ICD-10-CM | POA: Diagnosis not present

## 2022-05-03 DIAGNOSIS — M85852 Other specified disorders of bone density and structure, left thigh: Secondary | ICD-10-CM | POA: Diagnosis not present

## 2022-05-06 DIAGNOSIS — R350 Frequency of micturition: Secondary | ICD-10-CM | POA: Diagnosis not present

## 2022-05-06 DIAGNOSIS — R300 Dysuria: Secondary | ICD-10-CM | POA: Diagnosis not present

## 2022-05-11 DIAGNOSIS — M5030 Other cervical disc degeneration, unspecified cervical region: Secondary | ICD-10-CM | POA: Diagnosis not present

## 2022-05-11 DIAGNOSIS — M5412 Radiculopathy, cervical region: Secondary | ICD-10-CM | POA: Diagnosis not present

## 2022-05-11 DIAGNOSIS — M542 Cervicalgia: Secondary | ICD-10-CM | POA: Diagnosis not present

## 2022-05-11 DIAGNOSIS — M5136 Other intervertebral disc degeneration, lumbar region: Secondary | ICD-10-CM | POA: Diagnosis not present

## 2022-05-11 DIAGNOSIS — M4312 Spondylolisthesis, cervical region: Secondary | ICD-10-CM | POA: Diagnosis not present

## 2022-05-11 DIAGNOSIS — M545 Low back pain, unspecified: Secondary | ICD-10-CM | POA: Diagnosis not present

## 2022-05-20 DIAGNOSIS — M4802 Spinal stenosis, cervical region: Secondary | ICD-10-CM | POA: Diagnosis not present

## 2022-05-20 DIAGNOSIS — M5412 Radiculopathy, cervical region: Secondary | ICD-10-CM | POA: Diagnosis not present

## 2022-05-25 ENCOUNTER — Other Ambulatory Visit: Payer: Self-pay | Admitting: Neurology

## 2022-06-28 DIAGNOSIS — N951 Menopausal and female climacteric states: Secondary | ICD-10-CM | POA: Diagnosis not present

## 2022-06-28 DIAGNOSIS — E038 Other specified hypothyroidism: Secondary | ICD-10-CM | POA: Diagnosis not present

## 2022-07-01 DIAGNOSIS — R5383 Other fatigue: Secondary | ICD-10-CM | POA: Diagnosis not present

## 2022-07-01 DIAGNOSIS — Z6828 Body mass index (BMI) 28.0-28.9, adult: Secondary | ICD-10-CM | POA: Diagnosis not present

## 2022-07-01 DIAGNOSIS — N898 Other specified noninflammatory disorders of vagina: Secondary | ICD-10-CM | POA: Diagnosis not present

## 2022-07-01 DIAGNOSIS — N951 Menopausal and female climacteric states: Secondary | ICD-10-CM | POA: Diagnosis not present

## 2022-07-01 DIAGNOSIS — E038 Other specified hypothyroidism: Secondary | ICD-10-CM | POA: Diagnosis not present

## 2022-07-01 DIAGNOSIS — R635 Abnormal weight gain: Secondary | ICD-10-CM | POA: Diagnosis not present

## 2022-07-01 DIAGNOSIS — R232 Flushing: Secondary | ICD-10-CM | POA: Diagnosis not present

## 2022-08-04 ENCOUNTER — Ambulatory Visit: Payer: Medicare PPO | Admitting: Neurology

## 2022-08-09 DIAGNOSIS — M5412 Radiculopathy, cervical region: Secondary | ICD-10-CM | POA: Diagnosis not present

## 2022-08-09 DIAGNOSIS — G4700 Insomnia, unspecified: Secondary | ICD-10-CM | POA: Diagnosis not present

## 2022-08-09 DIAGNOSIS — M7918 Myalgia, other site: Secondary | ICD-10-CM | POA: Diagnosis not present

## 2022-08-09 DIAGNOSIS — M47899 Other spondylosis, site unspecified: Secondary | ICD-10-CM | POA: Diagnosis not present

## 2022-08-09 DIAGNOSIS — M4312 Spondylolisthesis, cervical region: Secondary | ICD-10-CM | POA: Diagnosis not present

## 2022-08-09 DIAGNOSIS — M4722 Other spondylosis with radiculopathy, cervical region: Secondary | ICD-10-CM | POA: Diagnosis not present

## 2022-08-09 DIAGNOSIS — G8929 Other chronic pain: Secondary | ICD-10-CM | POA: Diagnosis not present

## 2022-08-09 DIAGNOSIS — E785 Hyperlipidemia, unspecified: Secondary | ICD-10-CM | POA: Diagnosis not present

## 2022-08-09 DIAGNOSIS — M542 Cervicalgia: Secondary | ICD-10-CM | POA: Diagnosis not present

## 2022-08-22 DIAGNOSIS — Z681 Body mass index (BMI) 19 or less, adult: Secondary | ICD-10-CM | POA: Diagnosis not present

## 2022-08-22 DIAGNOSIS — R300 Dysuria: Secondary | ICD-10-CM | POA: Diagnosis not present

## 2022-09-13 DIAGNOSIS — Z411 Encounter for cosmetic surgery: Secondary | ICD-10-CM | POA: Diagnosis not present

## 2022-09-13 DIAGNOSIS — N951 Menopausal and female climacteric states: Secondary | ICD-10-CM | POA: Diagnosis not present

## 2022-09-13 DIAGNOSIS — D1801 Hemangioma of skin and subcutaneous tissue: Secondary | ICD-10-CM | POA: Diagnosis not present

## 2022-09-13 DIAGNOSIS — C44519 Basal cell carcinoma of skin of other part of trunk: Secondary | ICD-10-CM | POA: Diagnosis not present

## 2022-09-13 DIAGNOSIS — D485 Neoplasm of uncertain behavior of skin: Secondary | ICD-10-CM | POA: Diagnosis not present

## 2022-09-13 DIAGNOSIS — Z85828 Personal history of other malignant neoplasm of skin: Secondary | ICD-10-CM | POA: Diagnosis not present

## 2022-09-13 DIAGNOSIS — L814 Other melanin hyperpigmentation: Secondary | ICD-10-CM | POA: Diagnosis not present

## 2022-09-13 DIAGNOSIS — L309 Dermatitis, unspecified: Secondary | ICD-10-CM | POA: Diagnosis not present

## 2022-09-13 DIAGNOSIS — L821 Other seborrheic keratosis: Secondary | ICD-10-CM | POA: Diagnosis not present

## 2022-09-13 DIAGNOSIS — L578 Other skin changes due to chronic exposure to nonionizing radiation: Secondary | ICD-10-CM | POA: Diagnosis not present

## 2022-09-13 DIAGNOSIS — E038 Other specified hypothyroidism: Secondary | ICD-10-CM | POA: Diagnosis not present

## 2022-09-14 ENCOUNTER — Other Ambulatory Visit: Payer: Self-pay | Admitting: *Deleted

## 2022-09-14 DIAGNOSIS — G43909 Migraine, unspecified, not intractable, without status migrainosus: Secondary | ICD-10-CM

## 2022-09-14 MED ORDER — ELETRIPTAN HYDROBROMIDE 40 MG PO TABS: ORAL_TABLET | ORAL | 3 refills | Status: DC

## 2022-09-15 DIAGNOSIS — R232 Flushing: Secondary | ICD-10-CM | POA: Diagnosis not present

## 2022-09-15 DIAGNOSIS — Z6827 Body mass index (BMI) 27.0-27.9, adult: Secondary | ICD-10-CM | POA: Diagnosis not present

## 2022-09-15 DIAGNOSIS — E038 Other specified hypothyroidism: Secondary | ICD-10-CM | POA: Diagnosis not present

## 2022-09-15 DIAGNOSIS — N951 Menopausal and female climacteric states: Secondary | ICD-10-CM | POA: Diagnosis not present

## 2022-09-15 DIAGNOSIS — Z72820 Sleep deprivation: Secondary | ICD-10-CM | POA: Diagnosis not present

## 2022-09-15 DIAGNOSIS — F419 Anxiety disorder, unspecified: Secondary | ICD-10-CM | POA: Diagnosis not present

## 2022-10-07 DIAGNOSIS — C44519 Basal cell carcinoma of skin of other part of trunk: Secondary | ICD-10-CM | POA: Diagnosis not present

## 2022-10-19 DIAGNOSIS — E782 Mixed hyperlipidemia: Secondary | ICD-10-CM | POA: Diagnosis not present

## 2022-10-19 DIAGNOSIS — E559 Vitamin D deficiency, unspecified: Secondary | ICD-10-CM | POA: Diagnosis not present

## 2022-10-19 DIAGNOSIS — Z23 Encounter for immunization: Secondary | ICD-10-CM | POA: Diagnosis not present

## 2022-10-19 DIAGNOSIS — Z1331 Encounter for screening for depression: Secondary | ICD-10-CM | POA: Diagnosis not present

## 2022-10-19 DIAGNOSIS — F325 Major depressive disorder, single episode, in full remission: Secondary | ICD-10-CM | POA: Diagnosis not present

## 2022-10-19 DIAGNOSIS — N183 Chronic kidney disease, stage 3 unspecified: Secondary | ICD-10-CM | POA: Diagnosis not present

## 2022-10-19 DIAGNOSIS — G4700 Insomnia, unspecified: Secondary | ICD-10-CM | POA: Diagnosis not present

## 2022-10-19 DIAGNOSIS — Z0000 Encounter for general adult medical examination without abnormal findings: Secondary | ICD-10-CM | POA: Diagnosis not present

## 2022-10-19 DIAGNOSIS — G43009 Migraine without aura, not intractable, without status migrainosus: Secondary | ICD-10-CM | POA: Diagnosis not present

## 2022-10-21 DIAGNOSIS — R300 Dysuria: Secondary | ICD-10-CM | POA: Diagnosis not present

## 2022-10-21 DIAGNOSIS — Z6826 Body mass index (BMI) 26.0-26.9, adult: Secondary | ICD-10-CM | POA: Diagnosis not present

## 2022-11-23 ENCOUNTER — Other Ambulatory Visit: Payer: Self-pay | Admitting: Neurology

## 2022-11-23 DIAGNOSIS — E038 Other specified hypothyroidism: Secondary | ICD-10-CM | POA: Diagnosis not present

## 2022-11-23 DIAGNOSIS — N951 Menopausal and female climacteric states: Secondary | ICD-10-CM | POA: Diagnosis not present

## 2022-11-23 DIAGNOSIS — G43909 Migraine, unspecified, not intractable, without status migrainosus: Secondary | ICD-10-CM

## 2022-11-25 DIAGNOSIS — N951 Menopausal and female climacteric states: Secondary | ICD-10-CM | POA: Diagnosis not present

## 2022-11-25 DIAGNOSIS — Z6827 Body mass index (BMI) 27.0-27.9, adult: Secondary | ICD-10-CM | POA: Diagnosis not present

## 2022-11-25 DIAGNOSIS — N898 Other specified noninflammatory disorders of vagina: Secondary | ICD-10-CM | POA: Diagnosis not present

## 2022-11-25 DIAGNOSIS — Z72820 Sleep deprivation: Secondary | ICD-10-CM | POA: Diagnosis not present

## 2023-01-13 ENCOUNTER — Ambulatory Visit: Payer: Medicare PPO | Admitting: Neurology

## 2023-01-13 ENCOUNTER — Encounter: Payer: Self-pay | Admitting: Neurology

## 2023-01-13 VITALS — BP 128/76 | HR 71 | Ht 61.0 in | Wt 148.0 lb

## 2023-01-13 DIAGNOSIS — G43909 Migraine, unspecified, not intractable, without status migrainosus: Secondary | ICD-10-CM

## 2023-01-13 DIAGNOSIS — M542 Cervicalgia: Secondary | ICD-10-CM

## 2023-01-13 MED ORDER — ELETRIPTAN HYDROBROMIDE 40 MG PO TABS: ORAL_TABLET | ORAL | 11 refills | Status: AC

## 2023-01-13 NOTE — Progress Notes (Signed)
[facility]  PATIENT: Lisa Banks DOB: [birth year]  REFERRING DOCTOR OR PCP:  Mosetta Anis, MD [facility] Neurology) SOURCE: patient, records from Dr. Baltazar Najjar  _________________________________   HISTORICAL  CHIEF COMPLAINT:  Chief Complaint  Patient presents with   Room 10    Pt is here Alone. Pt states that things have been going good with her migraines    HISTORY OF PRESENT ILLNESS:  Lisa Banks is a 72 yo woman with headaches and insomnia.     Update 01/13/2023 Her migraines are doing better. They are only occurring 2 /month .   Relpax knocks them out practically every time.      She stopped nortriptyline and topiramate as she is doing better.        She has intermittent neck pain.   Meloxicam has helped.   She has been told by her PCP that her kidney function is fine.  She has C4C5 facet hypertrophy but RFA of medial branch nerves did not help much.    ESI has helped.    Mood is doing well on Lexapro and Wellbutrin.    Sleep is doing well.     Headache history: She has had migraine headaches since she was in her early teen years about 50 years ago. Initially, her migraines were intermittent and mostly associated with her period.   Then, in her 40s the headaches would also be triggered by stress and began to become more frequent. Sometimes she would have a week or 2 of headache in a row and then be better for a while. By her late 8s, the headaches were near daily. Typically, she would have a dull pain in the back of her head that would intensify on a near daily basis to include the entire head with pounding pain.  She would have nausea and sometimes vomiting. Additionally she had photophobia and phonophobia. Moving would make the headache worse.  Resting in a quiet dark room would help. She was placed on Relpax at some point and that would help the headaches but the headaches often came back the next day. Several years ago, when she was seen Dr. Ermalene Postin, she took  Botox for a couple years. That helped her headaches. She stopped Botox around 2014. She then started to see Dr. Baltazar Najjar and she placed her on Trokendi with Relpax as a rescue medication. That worked rather well for her until recently.    In early May, she had a tic bite. A couple days later she began to have a headache and the headache has been near daily since then. The headache is a little bit different than her typical migraine headaches in that it is more dull in the head and over the eyes without nausea and with less photophobia and phonophobia. Relpax helps but the headache will come back the next day.   REVIEW OF SYSTEMS: Constitutional: No fevers, chills, sweats, or change in appetite.  Reports insomnia Eyes: No visual changes, double vision, eye pain Ear, nose and throat: No hearing loss, ear pain, nasal congestion, sore throat Cardiovascular: No chest pain, palpitations Respiratory:  No shortness of breath at rest or with exertion.   No wheezes GastrointestinaI: No nausea, vomiting, diarrhea, abdominal pain, fecal incontinence Genitourinary:  No dysuria, urinary retention or frequency.  No nocturia. Musculoskeletal:  No neck pain, back pain Integumentary: No rash, pruritus, skin lesions Neurological: as above Psychiatric: Has anxiety but No depression Endocrine: No palpitations, diaphoresis, change in appetite, change in weigh or  increased thirst Hematologic/Lymphatic:  No anemia, purpura, petechiae. Allergic/Immunologic: No itchy/runny eyes, nasal congestion, recent allergic reactions, rashes  ALLERGIES: No Known Allergies  HOME MEDICATIONS:  Current Outpatient Medications:    buPROPion (WELLBUTRIN XL) 300 MG 24 hr tablet, TK 1 T PO QD IN THE MORNING, Disp: , Rfl:    Calcium Carbonate-Vitamin D 600-200 MG-UNIT TABS, Take by mouth., Disp: , Rfl:    escitalopram (LEXAPRO) 10 MG tablet, Take 10 mg by mouth daily., Disp: , Rfl:    ezetimibe (ZETIA) 10 MG tablet, Take 10 mg by mouth  daily., Disp: , Rfl:    liothyronine (CYTOMEL) 5 MCG tablet, Take 5 mcg by mouth daily., Disp: , Rfl:    meloxicam (MOBIC) 7.5 MG tablet, Take 7.5 mg by mouth daily., Disp: , Rfl:    tiZANidine (ZANAFLEX) 4 MG tablet, Take 4 mg by mouth as needed for muscle spasms., Disp: , Rfl:    zolpidem (AMBIEN) 5 MG tablet, Take 5 mg by mouth at bedtime as needed for sleep., Disp: , Rfl:    eletriptan (RELPAX) 40 MG tablet, TAKE 1 TABLET BY MOUTH AT ONSET OF MIGRAINE, MAY REPEAT IN 2 HOURS AS NEEDED, MAX 2 TABLETS DAILY, Disp: 9 tablet, Rfl: 11  PAST MEDICAL HISTORY: Past Medical History:  Diagnosis Date   Cancer ([place])    Headache    High cholesterol    Skin cancer    Vision abnormalities     PAST SURGICAL HISTORY: Past Surgical History:  Procedure Laterality Date   BLADDER REPAIR W/ CESAREAN SECTION     HYSTERECTOMY ABDOMINAL WITH SALPINGECTOMY     TONSILLECTOMY      FAMILY HISTORY: Family History  Problem Relation Age of Onset   Hypertension Mother    Dementia Mother    Thyroid disease Mother    Stroke Father     SOCIAL HISTORY:  Social History   Socioeconomic History   Marital status: Married    Spouse name: Not on file   Number of children: Not on file   Years of education: Not on file   Highest education level: Not on file  Occupational History   Not on file  Tobacco Use   Smoking status: Never   Smokeless tobacco: Never  Vaping Use   Vaping Use: Never used  Substance and Sexual Activity   Alcohol use: No    Alcohol/week: 0.0 standard drinks of alcohol   Drug use: No   Sexual activity: Not on file  Other Topics Concern   Not on file  Social History Narrative   Not on file   Social Determinants of Health   Financial Resource Strain: Not on file  Food Insecurity: Not on file  Transportation Needs: Not on file  Physical Activity: Not on file  Stress: Not on file  Social Connections: Not on file  Intimate Partner Violence: Not on file     PHYSICAL  EXAM  Vitals:   01/13/23 1059  BP: 128/76  Pulse: 71  Weight: 148 lb (67.1 kg)  Height: '5\' 1"'$  (1.549 m)    Body mass index is 27.96 kg/m.   General: The patient is well-developed and well-nourished and in no acute distress  Neck: No neck tenderness but range of motion in the neck is mildly reduced with right lateral bending and right rotation Neurologic Exam  Mental status: The patient is alert and oriented x 3 at the time of the examination. The patient has apparent normal recent and remote memory, with an apparently  normal attention span and concentration ability.   Speech is normal.  Cranial nerves: Extraocular movements are full. Facial strength and sensation is normal. Trapezius strength is normal.  Hearing appears normal.  Motor:  Muscle bulk is normal.   Tone is normal. Strength is  5 / 5 in all 4 extremities.   Sensory: Sensory testing is intact to touch and vibration sensation in arms .  Coordination: Cerebellar testing reveals good finger-nose-finger  bilaterally.  Gait and station: Station is normal.   Gait and tandem gait are normal.  Romberg is negative.  Reflexes: Deep tendon reflexes are symmetric and normal bilaterally.       ASSESSMENT AND PLAN  Migraine without status migrainosus, not intractable, unspecified migraine type - Plan: eletriptan (RELPAX) 40 MG tablet  Neck pain   1.   She is doing well with reduced headaches.  She will take Relpax when 1 occurs. Refill sent in 2.  Stay active and exercise as tolerated 3   neck pain is doing well.  She would prefer not to have surgery.  She will continue to take  meloxicam and prn tizanidine 4.   return in 6 months or sooner if there are new or worsening neurologic symptoms.   Elane Peabody A. Felecia Shelling, MD, PhD 0/92/3300, 76:22 PM Certified in Neurology, Clinical Neurophysiology, Sleep Medicine, Pain Medicine and Neuroimaging  [facility] [address] [place], [place]  [address] [phone]

## 2023-02-01 DIAGNOSIS — E038 Other specified hypothyroidism: Secondary | ICD-10-CM | POA: Diagnosis not present

## 2023-02-01 DIAGNOSIS — N951 Menopausal and female climacteric states: Secondary | ICD-10-CM | POA: Diagnosis not present

## 2023-02-03 DIAGNOSIS — Z72820 Sleep deprivation: Secondary | ICD-10-CM | POA: Diagnosis not present

## 2023-02-03 DIAGNOSIS — N951 Menopausal and female climacteric states: Secondary | ICD-10-CM | POA: Diagnosis not present

## 2023-02-03 DIAGNOSIS — Z6827 Body mass index (BMI) 27.0-27.9, adult: Secondary | ICD-10-CM | POA: Diagnosis not present

## 2023-02-03 DIAGNOSIS — Z79890 Hormone replacement therapy (postmenopausal): Secondary | ICD-10-CM | POA: Diagnosis not present

## 2023-02-03 DIAGNOSIS — N898 Other specified noninflammatory disorders of vagina: Secondary | ICD-10-CM | POA: Diagnosis not present

## 2023-02-07 DIAGNOSIS — N3000 Acute cystitis without hematuria: Secondary | ICD-10-CM | POA: Diagnosis not present

## 2023-02-07 DIAGNOSIS — N952 Postmenopausal atrophic vaginitis: Secondary | ICD-10-CM | POA: Diagnosis not present

## 2023-02-07 DIAGNOSIS — R3914 Feeling of incomplete bladder emptying: Secondary | ICD-10-CM | POA: Diagnosis not present

## 2023-02-17 DIAGNOSIS — N9419 Other specified dyspareunia: Secondary | ICD-10-CM | POA: Diagnosis not present

## 2023-02-17 DIAGNOSIS — R82998 Other abnormal findings in urine: Secondary | ICD-10-CM | POA: Diagnosis not present

## 2023-02-17 DIAGNOSIS — N390 Urinary tract infection, site not specified: Secondary | ICD-10-CM | POA: Diagnosis not present

## 2023-02-24 DIAGNOSIS — N3289 Other specified disorders of bladder: Secondary | ICD-10-CM | POA: Diagnosis not present

## 2023-02-24 DIAGNOSIS — N390 Urinary tract infection, site not specified: Secondary | ICD-10-CM | POA: Diagnosis not present

## 2023-02-28 DIAGNOSIS — N398 Other specified disorders of urinary system: Secondary | ICD-10-CM | POA: Diagnosis not present

## 2023-02-28 DIAGNOSIS — N390 Urinary tract infection, site not specified: Secondary | ICD-10-CM | POA: Diagnosis not present

## 2023-02-28 DIAGNOSIS — Z79899 Other long term (current) drug therapy: Secondary | ICD-10-CM | POA: Diagnosis not present

## 2023-02-28 DIAGNOSIS — N941 Unspecified dyspareunia: Secondary | ICD-10-CM | POA: Diagnosis not present

## 2023-02-28 DIAGNOSIS — K219 Gastro-esophageal reflux disease without esophagitis: Secondary | ICD-10-CM | POA: Diagnosis not present

## 2023-04-12 DIAGNOSIS — N398 Other specified disorders of urinary system: Secondary | ICD-10-CM | POA: Diagnosis not present

## 2023-04-12 DIAGNOSIS — Z9889 Other specified postprocedural states: Secondary | ICD-10-CM | POA: Diagnosis not present

## 2023-04-21 DIAGNOSIS — E782 Mixed hyperlipidemia: Secondary | ICD-10-CM | POA: Diagnosis not present

## 2023-04-21 DIAGNOSIS — G43009 Migraine without aura, not intractable, without status migrainosus: Secondary | ICD-10-CM | POA: Diagnosis not present

## 2023-04-21 DIAGNOSIS — G4700 Insomnia, unspecified: Secondary | ICD-10-CM | POA: Diagnosis not present

## 2023-04-21 DIAGNOSIS — F325 Major depressive disorder, single episode, in full remission: Secondary | ICD-10-CM | POA: Diagnosis not present

## 2023-05-03 DIAGNOSIS — E038 Other specified hypothyroidism: Secondary | ICD-10-CM | POA: Diagnosis not present

## 2023-05-03 DIAGNOSIS — N951 Menopausal and female climacteric states: Secondary | ICD-10-CM | POA: Diagnosis not present

## 2023-05-05 DIAGNOSIS — M2550 Pain in unspecified joint: Secondary | ICD-10-CM | POA: Diagnosis not present

## 2023-05-05 DIAGNOSIS — R5383 Other fatigue: Secondary | ICD-10-CM | POA: Diagnosis not present

## 2023-05-05 DIAGNOSIS — N898 Other specified noninflammatory disorders of vagina: Secondary | ICD-10-CM | POA: Diagnosis not present

## 2023-05-05 DIAGNOSIS — F419 Anxiety disorder, unspecified: Secondary | ICD-10-CM | POA: Diagnosis not present

## 2023-05-05 DIAGNOSIS — Z72820 Sleep deprivation: Secondary | ICD-10-CM | POA: Diagnosis not present

## 2023-05-05 DIAGNOSIS — Z6827 Body mass index (BMI) 27.0-27.9, adult: Secondary | ICD-10-CM | POA: Diagnosis not present

## 2023-05-05 DIAGNOSIS — R232 Flushing: Secondary | ICD-10-CM | POA: Diagnosis not present

## 2023-05-05 DIAGNOSIS — N951 Menopausal and female climacteric states: Secondary | ICD-10-CM | POA: Diagnosis not present

## 2023-05-05 DIAGNOSIS — E038 Other specified hypothyroidism: Secondary | ICD-10-CM | POA: Diagnosis not present

## 2023-05-09 DIAGNOSIS — Z1231 Encounter for screening mammogram for malignant neoplasm of breast: Secondary | ICD-10-CM | POA: Diagnosis not present

## 2023-07-06 DIAGNOSIS — Z6827 Body mass index (BMI) 27.0-27.9, adult: Secondary | ICD-10-CM | POA: Diagnosis not present

## 2023-07-06 DIAGNOSIS — R300 Dysuria: Secondary | ICD-10-CM | POA: Diagnosis not present

## 2023-07-06 DIAGNOSIS — R350 Frequency of micturition: Secondary | ICD-10-CM | POA: Diagnosis not present

## 2023-07-09 DIAGNOSIS — R300 Dysuria: Secondary | ICD-10-CM | POA: Diagnosis not present

## 2023-07-18 DIAGNOSIS — E038 Other specified hypothyroidism: Secondary | ICD-10-CM | POA: Diagnosis not present

## 2023-07-18 DIAGNOSIS — N951 Menopausal and female climacteric states: Secondary | ICD-10-CM | POA: Diagnosis not present

## 2023-07-20 DIAGNOSIS — Z79890 Hormone replacement therapy (postmenopausal): Secondary | ICD-10-CM | POA: Diagnosis not present

## 2023-07-20 DIAGNOSIS — R232 Flushing: Secondary | ICD-10-CM | POA: Diagnosis not present

## 2023-07-20 DIAGNOSIS — E038 Other specified hypothyroidism: Secondary | ICD-10-CM | POA: Diagnosis not present

## 2023-07-20 DIAGNOSIS — N951 Menopausal and female climacteric states: Secondary | ICD-10-CM | POA: Diagnosis not present

## 2023-07-20 DIAGNOSIS — Z72820 Sleep deprivation: Secondary | ICD-10-CM | POA: Diagnosis not present

## 2023-07-20 DIAGNOSIS — Z6828 Body mass index (BMI) 28.0-28.9, adult: Secondary | ICD-10-CM | POA: Diagnosis not present

## 2023-09-05 DIAGNOSIS — R300 Dysuria: Secondary | ICD-10-CM | POA: Diagnosis not present

## 2023-09-12 DIAGNOSIS — R399 Unspecified symptoms and signs involving the genitourinary system: Secondary | ICD-10-CM | POA: Diagnosis not present

## 2023-09-19 DIAGNOSIS — N390 Urinary tract infection, site not specified: Secondary | ICD-10-CM | POA: Diagnosis not present

## 2023-09-26 DIAGNOSIS — N951 Menopausal and female climacteric states: Secondary | ICD-10-CM | POA: Diagnosis not present

## 2023-09-26 DIAGNOSIS — E038 Other specified hypothyroidism: Secondary | ICD-10-CM | POA: Diagnosis not present

## 2023-09-28 DIAGNOSIS — N951 Menopausal and female climacteric states: Secondary | ICD-10-CM | POA: Diagnosis not present

## 2023-09-28 DIAGNOSIS — R232 Flushing: Secondary | ICD-10-CM | POA: Diagnosis not present

## 2023-09-28 DIAGNOSIS — Z6828 Body mass index (BMI) 28.0-28.9, adult: Secondary | ICD-10-CM | POA: Diagnosis not present

## 2023-09-28 DIAGNOSIS — R5383 Other fatigue: Secondary | ICD-10-CM | POA: Diagnosis not present

## 2023-09-28 DIAGNOSIS — Z79890 Hormone replacement therapy (postmenopausal): Secondary | ICD-10-CM | POA: Diagnosis not present

## 2023-10-08 ENCOUNTER — Other Ambulatory Visit: Payer: Self-pay

## 2023-10-08 ENCOUNTER — Emergency Department
Admission: EM | Admit: 2023-10-08 | Discharge: 2023-10-08 | Disposition: A | Discharge status: Home / Self Care | Payer: Medicare PPO | Attending: Emergency Medicine | Admitting: Emergency Medicine

## 2023-10-08 DIAGNOSIS — N3000 Acute cystitis without hematuria: Secondary | ICD-10-CM | POA: Diagnosis not present

## 2023-10-08 DIAGNOSIS — R300 Dysuria: Secondary | ICD-10-CM | POA: Diagnosis present

## 2023-10-08 LAB — URINALYSIS, ROUTINE W REFLEX MICROSCOPIC
Bilirubin Urine: NEGATIVE
Glucose, UA: NEGATIVE mg/dL
Hgb urine dipstick: NEGATIVE
Ketones, ur: NEGATIVE mg/dL
Nitrite: POSITIVE — AB
Protein, ur: NEGATIVE mg/dL
Specific Gravity, Urine: 1.02 (ref 1.005–1.030)
WBC, UA: >50 WBC/hpf (ref 0–5)
pH: 5 (ref 5.0–8.0)

## 2023-10-08 LAB — BASIC METABOLIC PANEL
Anion gap: 10 (ref 5–15)
BUN: 16 mg/dL (ref 8–23)
CO2: 22 mmol/L (ref 22–32)
Calcium: 8.7 mg/dL — ABNORMAL LOW (ref 8.9–10.3)
Chloride: 104 mmol/L (ref 98–111)
Creatinine, Ser: 0.81 mg/dL (ref 0.44–1.00)
GFR, Estimated: >60 mL/min (ref >60)
Glucose, Bld: 93 mg/dL (ref 70–99)
Potassium: 4 mmol/L (ref 3.5–5.1)
Sodium: 136 mmol/L (ref 135–145)

## 2023-10-08 LAB — CBC
HCT: 42.9 % (ref 36.0–46.0)
Hemoglobin: 14.5 g/dL (ref 12.0–15.0)
MCH: 30.5 pg (ref 26.0–34.0)
MCHC: 33.8 g/dL (ref 30.0–36.0)
MCV: 90.3 fL (ref 80.0–100.0)
Platelets: 229 10*3/uL (ref 150–400)
RBC: 4.75 MIL/uL (ref 3.87–5.11)
RDW: 11.6 % (ref 11.5–15.5)
WBC: 7.9 10*3/uL (ref 4.0–10.5)
nRBC: 0 % (ref 0.0–0.2)

## 2023-10-08 MED ORDER — LIDOCAINE HCL (PF) 1 % IJ SOLN
5.0000 mL | Freq: Once | INTRAMUSCULAR | Status: AC
  Administered 2023-10-08: 5 mL
  Filled 2023-10-08: qty 5

## 2023-10-08 MED ORDER — CEFTRIAXONE SODIUM 1 G IJ SOLR
500.0000 mg | Freq: Once | INTRAMUSCULAR | Status: AC
  Administered 2023-10-08: 500 mg via INTRAMUSCULAR
  Filled 2023-10-08: qty 10

## 2023-10-08 MED ORDER — CEPHALEXIN 500 MG PO CAPS: 500.0000 mg | ORAL_CAPSULE | Freq: Three times a day (TID) | ORAL | 0 refills | Status: AC

## 2023-10-08 NOTE — Discharge Instructions (Addendum)
Take the antibiotic as directed. Your urine culture is pending. Follow-up with your Urologist as discussed.

## 2023-10-08 NOTE — ED Triage Notes (Signed)
Pt sts that she has had a UTI for the last 4 wks and has been on multiple antibiotics for it and nothing is helping. Pt sts that she is now getting pelvic pain due to it.

## 2023-10-09 NOTE — ED Provider Notes (Signed)
[facility] Emergency Department Provider Note     Event Date/Time   First MD Initiated Contact with Patient 10/08/23 2134     (approximate)   History   Recurrent UTI   HPI  Lisa Banks is a 72 y.o. female with a history of voiding dysfunction, mid urethral sling repair, and recurrent UTI, presents to the ED for evaluation of symptoms consistent with a UTI.  Patient has been treated initially with a televisit with her primary provider, and secondarily with a in office visit with her urologist.  Chart review reveals she is been treated with nitrofurantoin as well as more recently Bactrim.  Recent urine culture dated 9/23, reveals colonization with E. coli.  Also reveals that the bacteria is resistant to recently prescribed Bactrim.  Patient presents to the ED noting pelvic pain and bladder spasms as well as dysuria.  Denies any fevers, chills, sweats.  She also denies any hematuria or urinary retention.   Physical Exam   Triage Vital Signs: ED Triage Vitals [10/08/23 1848]  Encounter Vitals Group     BP (!) 144/68     Systolic BP Percentile      Diastolic BP Percentile      Pulse Rate 88     Resp 19     Temp 98.7 F (37.1 C)     Temp Source Oral     SpO2 98 %     Weight 144 lb (65.3 kg)     Height 5\' 1"  (1.549 m)     Head Circumference      Peak Flow      Pain Score 5     Pain Loc      Pain Education      Exclude from Growth Chart     Most recent vital signs: Vitals:   10/08/23 1848 10/08/23 2249  BP: (!) 144/68 137/64  Pulse: 88 84  Resp: 19 17  Temp: 98.7 F (37.1 C) 98.9 F (37.2 C)  SpO2: 98% 99%    General Awake, no distress. HEENT NCAT. PERRL. EOMI. No rhinorrhea. Mucous membranes are moist.  CV:  Good peripheral perfusion.  RESP:  Normal effort.  ABD:  No distention.    ED Results / Procedures / Treatments   Labs (all labs ordered are listed, but only abnormal results are displayed) Labs Reviewed  URINALYSIS,  ROUTINE W REFLEX MICROSCOPIC - Abnormal; Notable for the following components:      Result Value   Color, Urine AMBER (*)    APPearance HAZY (*)    Nitrite POSITIVE (*)    Leukocytes,Ua MODERATE (*)    Bacteria, UA MANY (*)    All other components within normal limits  BASIC METABOLIC PANEL - Abnormal; Notable for the following components:   Calcium 8.7 (*)    All other components within normal limits  URINE CULTURE  CBC     EKG   RADIOLOGY   No results found.   PROCEDURES:  Critical Care performed: No  Procedures   MEDICATIONS ORDERED IN ED: Medications  cefTRIAXone (ROCEPHIN) injection 500 mg (500 mg Intramuscular Given 10/08/23 2227)  lidocaine (PF) (XYLOCAINE) 1 % injection 5 mL (5 mLs Infiltration Given 10/08/23 2231)     IMPRESSION / MDM / ASSESSMENT AND PLAN / ED COURSE  I reviewed the triage vital signs and the nursing notes.  Differential diagnosis includes, but is not limited to, UTI, pyelonephritis, vaginitis  Patient's presentation is most consistent with acute complicated illness / injury requiring diagnostic workup.  Patient's diagnosis is consistent with acute cystitis with recent culture evidence consistent with E. coli colonization.  Patient has been treating with a resistant antibiotic.  She was treated with IM dose of Rocephin in the ED had a 7-day course of Keflex.  Urine culture is pending at this time for ongoing medical management. Patient is to follow up with her PCP or urologist as discussed, as needed or otherwise directed. Patient is given ED precautions to return to the ED for any worsening or new symptoms.  FINAL CLINICAL IMPRESSION(S) / ED DIAGNOSES   Final diagnoses:  Acute cystitis without hematuria     Rx / DC Orders   ED Discharge Orders          Ordered    cephALEXin (KEFLEX) 500 MG capsule  3 times daily        10/08/23 2221             Note:  This document was prepared using Dragon  voice recognition software and may include unintentional dictation errors.    Lissa Hoard, PA-C 10/09/23 4098    Chesley Noon, MD 10/09/23 [phone]

## 2023-10-10 LAB — URINE CULTURE
Culture: 80000 — AB
Special Requests: NORMAL

## 2023-10-12 DIAGNOSIS — Z09 Encounter for follow-up examination after completed treatment for conditions other than malignant neoplasm: Secondary | ICD-10-CM | POA: Diagnosis not present

## 2023-10-12 DIAGNOSIS — K5140 Inflammatory polyps of colon without complications: Secondary | ICD-10-CM | POA: Diagnosis not present

## 2023-10-12 DIAGNOSIS — Z860100 Personal history of colon polyps, unspecified: Secondary | ICD-10-CM | POA: Diagnosis not present

## 2023-10-24 DIAGNOSIS — N951 Menopausal and female climacteric states: Secondary | ICD-10-CM | POA: Diagnosis not present

## 2023-10-24 DIAGNOSIS — E038 Other specified hypothyroidism: Secondary | ICD-10-CM | POA: Diagnosis not present

## 2023-10-25 DIAGNOSIS — E782 Mixed hyperlipidemia: Secondary | ICD-10-CM | POA: Diagnosis not present

## 2023-10-25 DIAGNOSIS — Z0000 Encounter for general adult medical examination without abnormal findings: Secondary | ICD-10-CM | POA: Diagnosis not present

## 2023-10-25 DIAGNOSIS — G43009 Migraine without aura, not intractable, without status migrainosus: Secondary | ICD-10-CM | POA: Diagnosis not present

## 2023-10-25 DIAGNOSIS — G4700 Insomnia, unspecified: Secondary | ICD-10-CM | POA: Diagnosis not present

## 2023-10-25 DIAGNOSIS — Z23 Encounter for immunization: Secondary | ICD-10-CM | POA: Diagnosis not present

## 2023-10-25 DIAGNOSIS — F325 Major depressive disorder, single episode, in full remission: Secondary | ICD-10-CM | POA: Diagnosis not present

## 2023-10-25 DIAGNOSIS — F419 Anxiety disorder, unspecified: Secondary | ICD-10-CM | POA: Diagnosis not present

## 2023-10-25 DIAGNOSIS — Z1331 Encounter for screening for depression: Secondary | ICD-10-CM | POA: Diagnosis not present

## 2023-10-26 DIAGNOSIS — E038 Other specified hypothyroidism: Secondary | ICD-10-CM | POA: Diagnosis not present

## 2023-10-26 DIAGNOSIS — Z79890 Hormone replacement therapy (postmenopausal): Secondary | ICD-10-CM | POA: Diagnosis not present

## 2023-10-26 DIAGNOSIS — Z6828 Body mass index (BMI) 28.0-28.9, adult: Secondary | ICD-10-CM | POA: Diagnosis not present

## 2023-10-26 DIAGNOSIS — Z72820 Sleep deprivation: Secondary | ICD-10-CM | POA: Diagnosis not present

## 2023-10-26 DIAGNOSIS — N951 Menopausal and female climacteric states: Secondary | ICD-10-CM | POA: Diagnosis not present

## 2023-10-26 DIAGNOSIS — R232 Flushing: Secondary | ICD-10-CM | POA: Diagnosis not present

## 2023-11-08 ENCOUNTER — Telehealth: Payer: Self-pay | Admitting: *Deleted

## 2023-11-08 NOTE — Telephone Encounter (Signed)
Transition Care Management Unsuccessful Follow-up Telephone Call  Date of discharge and from where:  [facility]  10/08/2023  Attempts:  1st Attempt  Reason for unsuccessful TCM follow-up call:  Left voice message

## 2023-11-09 DIAGNOSIS — Z411 Encounter for cosmetic surgery: Secondary | ICD-10-CM | POA: Diagnosis not present

## 2023-11-09 DIAGNOSIS — Z85828 Personal history of other malignant neoplasm of skin: Secondary | ICD-10-CM | POA: Diagnosis not present

## 2023-11-09 DIAGNOSIS — L578 Other skin changes due to chronic exposure to nonionizing radiation: Secondary | ICD-10-CM | POA: Diagnosis not present

## 2023-11-09 DIAGNOSIS — D485 Neoplasm of uncertain behavior of skin: Secondary | ICD-10-CM | POA: Diagnosis not present

## 2023-11-09 DIAGNOSIS — L219 Seborrheic dermatitis, unspecified: Secondary | ICD-10-CM | POA: Diagnosis not present

## 2023-11-09 DIAGNOSIS — D225 Melanocytic nevi of trunk: Secondary | ICD-10-CM | POA: Diagnosis not present

## 2023-11-09 DIAGNOSIS — L821 Other seborrheic keratosis: Secondary | ICD-10-CM | POA: Diagnosis not present

## 2023-11-09 DIAGNOSIS — L814 Other melanin hyperpigmentation: Secondary | ICD-10-CM | POA: Diagnosis not present

## 2023-11-23 DIAGNOSIS — N3090 Cystitis, unspecified without hematuria: Secondary | ICD-10-CM | POA: Diagnosis not present

## 2023-12-02 DIAGNOSIS — N3000 Acute cystitis without hematuria: Secondary | ICD-10-CM | POA: Diagnosis not present

## 2023-12-02 DIAGNOSIS — R300 Dysuria: Secondary | ICD-10-CM | POA: Diagnosis not present

## 2023-12-15 DIAGNOSIS — N952 Postmenopausal atrophic vaginitis: Secondary | ICD-10-CM | POA: Diagnosis not present

## 2023-12-15 DIAGNOSIS — N390 Urinary tract infection, site not specified: Secondary | ICD-10-CM | POA: Diagnosis not present

## 2023-12-26 DIAGNOSIS — E782 Mixed hyperlipidemia: Secondary | ICD-10-CM | POA: Diagnosis not present

## 2024-01-24 DIAGNOSIS — N951 Menopausal and female climacteric states: Secondary | ICD-10-CM | POA: Diagnosis not present

## 2024-01-24 DIAGNOSIS — E038 Other specified hypothyroidism: Secondary | ICD-10-CM | POA: Diagnosis not present

## 2024-01-26 DIAGNOSIS — E039 Hypothyroidism, unspecified: Secondary | ICD-10-CM | POA: Diagnosis not present

## 2024-01-26 DIAGNOSIS — Z6827 Body mass index (BMI) 27.0-27.9, adult: Secondary | ICD-10-CM | POA: Diagnosis not present

## 2024-01-26 DIAGNOSIS — N951 Menopausal and female climacteric states: Secondary | ICD-10-CM | POA: Diagnosis not present

## 2024-01-26 DIAGNOSIS — Z72820 Sleep deprivation: Secondary | ICD-10-CM | POA: Diagnosis not present

## 2024-01-26 DIAGNOSIS — Z79890 Hormone replacement therapy (postmenopausal): Secondary | ICD-10-CM | POA: Diagnosis not present

## 2024-01-26 DIAGNOSIS — R5383 Other fatigue: Secondary | ICD-10-CM | POA: Diagnosis not present

## 2024-02-19 DIAGNOSIS — S0990XA Unspecified injury of head, initial encounter: Secondary | ICD-10-CM | POA: Diagnosis not present

## 2024-02-19 DIAGNOSIS — I213 ST elevation (STEMI) myocardial infarction of unspecified site: Secondary | ICD-10-CM | POA: Diagnosis not present

## 2024-02-19 DIAGNOSIS — F32A Depression, unspecified: Secondary | ICD-10-CM | POA: Diagnosis not present

## 2024-02-19 DIAGNOSIS — S82891A Other fracture of right lower leg, initial encounter for closed fracture: Secondary | ICD-10-CM | POA: Diagnosis not present

## 2024-02-19 DIAGNOSIS — Z888 Allergy status to other drugs, medicaments and biological substances status: Secondary | ICD-10-CM | POA: Diagnosis not present

## 2024-02-19 DIAGNOSIS — S82851A Displaced trimalleolar fracture of right lower leg, initial encounter for closed fracture: Secondary | ICD-10-CM | POA: Diagnosis not present

## 2024-02-19 DIAGNOSIS — I10 Essential (primary) hypertension: Secondary | ICD-10-CM | POA: Diagnosis not present

## 2024-02-19 DIAGNOSIS — F419 Anxiety disorder, unspecified: Secondary | ICD-10-CM | POA: Diagnosis not present

## 2024-02-19 DIAGNOSIS — Z8673 Personal history of transient ischemic attack (TIA), and cerebral infarction without residual deficits: Secondary | ICD-10-CM | POA: Diagnosis not present

## 2024-02-19 DIAGNOSIS — W19XXXA Unspecified fall, initial encounter: Secondary | ICD-10-CM | POA: Diagnosis not present

## 2024-02-19 DIAGNOSIS — Z885 Allergy status to narcotic agent status: Secondary | ICD-10-CM | POA: Diagnosis not present

## 2024-02-19 DIAGNOSIS — I6782 Cerebral ischemia: Secondary | ICD-10-CM | POA: Diagnosis not present

## 2024-02-20 DIAGNOSIS — S82851A Displaced trimalleolar fracture of right lower leg, initial encounter for closed fracture: Secondary | ICD-10-CM | POA: Diagnosis not present

## 2024-02-21 DIAGNOSIS — S93401A Sprain of unspecified ligament of right ankle, initial encounter: Secondary | ICD-10-CM | POA: Diagnosis not present

## 2024-02-21 DIAGNOSIS — S82851A Displaced trimalleolar fracture of right lower leg, initial encounter for closed fracture: Secondary | ICD-10-CM | POA: Diagnosis not present

## 2024-02-22 DIAGNOSIS — Z01818 Encounter for other preprocedural examination: Secondary | ICD-10-CM | POA: Diagnosis not present

## 2024-02-23 DIAGNOSIS — S93431A Sprain of tibiofibular ligament of right ankle, initial encounter: Secondary | ICD-10-CM | POA: Diagnosis not present

## 2024-02-23 DIAGNOSIS — S82851A Displaced trimalleolar fracture of right lower leg, initial encounter for closed fracture: Secondary | ICD-10-CM | POA: Diagnosis not present

## 2024-02-23 DIAGNOSIS — G8918 Other acute postprocedural pain: Secondary | ICD-10-CM | POA: Diagnosis not present

## 2024-03-07 DIAGNOSIS — S82851D Displaced trimalleolar fracture of right lower leg, subsequent encounter for closed fracture with routine healing: Secondary | ICD-10-CM | POA: Diagnosis not present

## 2024-03-07 DIAGNOSIS — S82851A Displaced trimalleolar fracture of right lower leg, initial encounter for closed fracture: Secondary | ICD-10-CM | POA: Diagnosis not present

## 2024-03-09 DIAGNOSIS — M25571 Pain in right ankle and joints of right foot: Secondary | ICD-10-CM | POA: Diagnosis not present

## 2024-03-29 DIAGNOSIS — M25571 Pain in right ankle and joints of right foot: Secondary | ICD-10-CM | POA: Diagnosis not present

## 2024-03-29 DIAGNOSIS — Z4889 Encounter for other specified surgical aftercare: Secondary | ICD-10-CM | POA: Diagnosis not present

## 2024-04-23 DIAGNOSIS — E7841 Elevated Lipoprotein(a): Secondary | ICD-10-CM | POA: Diagnosis not present

## 2024-04-23 DIAGNOSIS — R5383 Other fatigue: Secondary | ICD-10-CM | POA: Diagnosis not present

## 2024-04-23 DIAGNOSIS — R7989 Other specified abnormal findings of blood chemistry: Secondary | ICD-10-CM | POA: Diagnosis not present

## 2024-04-23 DIAGNOSIS — N951 Menopausal and female climacteric states: Secondary | ICD-10-CM | POA: Diagnosis not present

## 2024-04-23 DIAGNOSIS — E039 Hypothyroidism, unspecified: Secondary | ICD-10-CM | POA: Diagnosis not present

## 2024-04-25 DIAGNOSIS — E039 Hypothyroidism, unspecified: Secondary | ICD-10-CM | POA: Diagnosis not present

## 2024-04-25 DIAGNOSIS — Z6826 Body mass index (BMI) 26.0-26.9, adult: Secondary | ICD-10-CM | POA: Diagnosis not present

## 2024-04-25 DIAGNOSIS — E786 Lipoprotein deficiency: Secondary | ICD-10-CM | POA: Diagnosis not present

## 2024-04-25 DIAGNOSIS — R7989 Other specified abnormal findings of blood chemistry: Secondary | ICD-10-CM | POA: Diagnosis not present

## 2024-04-25 DIAGNOSIS — Z79890 Hormone replacement therapy (postmenopausal): Secondary | ICD-10-CM | POA: Diagnosis not present

## 2024-04-25 DIAGNOSIS — R5383 Other fatigue: Secondary | ICD-10-CM | POA: Diagnosis not present

## 2024-04-25 DIAGNOSIS — N951 Menopausal and female climacteric states: Secondary | ICD-10-CM | POA: Diagnosis not present

## 2024-04-25 DIAGNOSIS — M2550 Pain in unspecified joint: Secondary | ICD-10-CM | POA: Diagnosis not present

## 2024-05-31 DIAGNOSIS — Z1231 Encounter for screening mammogram for malignant neoplasm of breast: Secondary | ICD-10-CM | POA: Diagnosis not present

## 2024-05-31 DIAGNOSIS — M25571 Pain in right ankle and joints of right foot: Secondary | ICD-10-CM | POA: Diagnosis not present

## 2024-07-24 DIAGNOSIS — N951 Menopausal and female climacteric states: Secondary | ICD-10-CM | POA: Diagnosis not present

## 2024-07-24 DIAGNOSIS — E039 Hypothyroidism, unspecified: Secondary | ICD-10-CM | POA: Diagnosis not present

## 2024-07-26 DIAGNOSIS — E039 Hypothyroidism, unspecified: Secondary | ICD-10-CM | POA: Diagnosis not present

## 2024-07-26 DIAGNOSIS — L659 Nonscarring hair loss, unspecified: Secondary | ICD-10-CM | POA: Diagnosis not present

## 2024-07-26 DIAGNOSIS — Z6824 Body mass index (BMI) 24.0-24.9, adult: Secondary | ICD-10-CM | POA: Diagnosis not present

## 2024-07-26 DIAGNOSIS — M2550 Pain in unspecified joint: Secondary | ICD-10-CM | POA: Diagnosis not present

## 2024-07-26 DIAGNOSIS — N951 Menopausal and female climacteric states: Secondary | ICD-10-CM | POA: Diagnosis not present

## 2024-07-26 DIAGNOSIS — Z72820 Sleep deprivation: Secondary | ICD-10-CM | POA: Diagnosis not present

## 2024-07-26 DIAGNOSIS — Z79890 Hormone replacement therapy (postmenopausal): Secondary | ICD-10-CM | POA: Diagnosis not present

## 2024-08-30 DIAGNOSIS — M25571 Pain in right ankle and joints of right foot: Secondary | ICD-10-CM | POA: Diagnosis not present

## 2024-09-05 DIAGNOSIS — R5383 Other fatigue: Secondary | ICD-10-CM | POA: Diagnosis not present

## 2024-09-05 DIAGNOSIS — E039 Hypothyroidism, unspecified: Secondary | ICD-10-CM | POA: Diagnosis not present

## 2024-09-05 DIAGNOSIS — R718 Other abnormality of red blood cells: Secondary | ICD-10-CM | POA: Diagnosis not present

## 2024-09-10 DIAGNOSIS — Z6824 Body mass index (BMI) 24.0-24.9, adult: Secondary | ICD-10-CM | POA: Diagnosis not present

## 2024-09-10 DIAGNOSIS — R232 Flushing: Secondary | ICD-10-CM | POA: Diagnosis not present

## 2024-09-10 DIAGNOSIS — F419 Anxiety disorder, unspecified: Secondary | ICD-10-CM | POA: Diagnosis not present

## 2024-09-10 DIAGNOSIS — N898 Other specified noninflammatory disorders of vagina: Secondary | ICD-10-CM | POA: Diagnosis not present

## 2024-09-10 DIAGNOSIS — E039 Hypothyroidism, unspecified: Secondary | ICD-10-CM | POA: Diagnosis not present

## 2024-09-10 DIAGNOSIS — N951 Menopausal and female climacteric states: Secondary | ICD-10-CM | POA: Diagnosis not present

## 2025-01-16 ENCOUNTER — Other Ambulatory Visit: Payer: Self-pay | Admitting: Family Medicine

## 2025-01-16 DIAGNOSIS — M5416 Radiculopathy, lumbar region: Secondary | ICD-10-CM

## 2025-01-16 DIAGNOSIS — M5412 Radiculopathy, cervical region: Secondary | ICD-10-CM

## 2025-01-17 ENCOUNTER — Ambulatory Visit
Admission: RE | Admit: 2025-01-17 | Discharge: 2025-01-17 | Disposition: A | Discharge status: Home / Self Care | Source: Ambulatory Visit | Attending: Family Medicine | Admitting: Family Medicine

## 2025-01-17 DIAGNOSIS — M5416 Radiculopathy, lumbar region: Secondary | ICD-10-CM

## 2025-01-17 DIAGNOSIS — M5412 Radiculopathy, cervical region: Secondary | ICD-10-CM
